# Patient Record
Sex: Female | Born: 1995 | Race: White | Hispanic: No | Marital: Single | Smoking: Former smoker
Health system: Southern US, Community
[De-identification: ages and names within clinical notes are randomized; demographics above are authoritative.]

## PROBLEM LIST (undated history)

## (undated) DIAGNOSIS — F4310 Post-traumatic stress disorder, unspecified: Secondary | ICD-10-CM

## (undated) DIAGNOSIS — F603 Borderline personality disorder: Secondary | ICD-10-CM

## (undated) DIAGNOSIS — F319 Bipolar disorder, unspecified: Secondary | ICD-10-CM

## (undated) HISTORY — PX: FRACTURE SURGERY: SHX138

---

## 2017-02-02 ENCOUNTER — Ambulatory Visit (HOSPITAL_COMMUNITY)
Admission: EM | Admit: 2017-02-02 | Discharge: 2017-02-02 | Disposition: A | Discharge status: Home / Self Care | Payer: Medicaid Other | Attending: Family Medicine | Admitting: Family Medicine

## 2017-02-02 ENCOUNTER — Encounter (HOSPITAL_COMMUNITY): Payer: Self-pay | Admitting: Emergency Medicine

## 2017-02-02 DIAGNOSIS — L03032 Cellulitis of left toe: Secondary | ICD-10-CM | POA: Diagnosis not present

## 2017-02-02 HISTORY — DX: Post-traumatic stress disorder, unspecified: F43.10

## 2017-02-02 HISTORY — DX: Bipolar disorder, unspecified: F31.9

## 2017-02-02 HISTORY — DX: Borderline personality disorder: F60.3

## 2017-02-02 MED ORDER — MUPIROCIN CALCIUM 2 % EX CREA: 1.0000 "application " | TOPICAL_CREAM | Freq: Two times a day (BID) | CUTANEOUS | 0 refills | Status: AC

## 2017-02-02 MED ORDER — CEPHALEXIN 500 MG PO CAPS: 500.0000 mg | ORAL_CAPSULE | Freq: Four times a day (QID) | ORAL | 0 refills | Status: AC

## 2017-02-02 NOTE — ED Triage Notes (Signed)
PT reports infected ingrown toenail on left great toe.

## 2017-02-02 NOTE — Discharge Instructions (Signed)
Oral antibiotic and antibiotic cream sent in.  Take oral antibiotic 4 times a day for 5 days. Conintue to do warm soaks with soapy water. Dry area after soak. Add cream to area after soak. Try to do 2-3 times a da. Try to keep nails trimmed to prevent ingrown nails. Please return in 4 days at the end of the week if nail is not improving.

## 2017-02-02 NOTE — ED Provider Notes (Signed)
[facility]    CSN: [id number] Arrival date & time: 02/02/17  [id number]     History   Chief Complaint Chief Complaint  Patient presents with  . Nail Problem    HPI Carrie Carr is a 21 y.o. female presenting with pain and redness to her left great toe. Patient accompanied by group home mother. She states 3 months ago when she was in jail she had one of her jailmates cut away an ingrown toe nail. She cannot state how they did this. Since this time she has had continues pain and redness around her toe. She was recently released and joined a group home. At the group home they have tried epsom salt soaks once a day and OTC triple antibiotic cream. Patient states it has improved slightly but not much. Area is draining as there is often blood on her sheets and socks. No fevers, no numbness. Pain worsens with movement.  HPI  Past Medical History:  Diagnosis Date  . Bipolar 1 disorder (HCC)   . Borderline personality disorder (HCC)   . PTSD (post-traumatic stress disorder)     There are no active problems to display for this patient.   Past Surgical History:  Procedure Laterality Date  . FRACTURE SURGERY      OB History    No data available       Home Medications    Prior to Admission medications   Medication Sig Start Date End Date Taking? Authorizing Provider  Melatonin 3 MG TABS Take 6 mg by mouth.   Yes [provider]  metFORMIN (GLUCOPHAGE-XR) 500 MG 24 hr tablet Take 500 mg by mouth daily with breakfast.   Yes [provider]  Oxcarbazepine (TRILEPTAL) 300 MG tablet Take 300 mg by mouth 2 (two) times daily.   Yes [provider]  propranolol (INDERAL) 10 MG tablet Take 10 mg by mouth 3 (three) times daily.   Yes [provider]  cephALEXin (KEFLEX) 500 MG capsule Take 1 capsule (500 mg total) by mouth 4 (four) times daily for 5 days. 02/02/17 02/07/17  Alazia Crocket C, PA-C  mupirocin cream (BACTROBAN) 2 % Apply 1  application topically 2 (two) times daily for 7 days. 02/02/17 02/09/17  Keelynn Furgerson, Junius CreamerHallie C, PA-C    Family History No family history on file.  Social History Social History   Tobacco Use  . Smoking status: Never Smoker  . Smokeless tobacco: Never Used  Substance Use Topics  . Alcohol use: No    Frequency: Never  . Drug use: No     Allergies   Thorazine [chlorpromazine]   Review of Systems Review of Systems  Constitutional: Negative for fatigue and fever.  Gastrointestinal: Negative for nausea and vomiting.  Musculoskeletal: Negative for gait problem.       Left great toe pain  Skin: Positive for color change. Negative for pallor.     Physical Exam Triage Vital Signs ED Triage Vitals  Enc Vitals Group     BP 02/02/17 1019 104/71     Pulse Rate 02/02/17 1018 74     Resp 02/02/17 1018 16     Temp 02/02/17 1018 98.5 F (36.9 C)     Temp Source 02/02/17 1018 Oral     SpO2 02/02/17 1018 97 %     Weight 02/02/17 1019 234 lb (106.1 kg)     Height 02/02/17 1017 5\' 3"  (1.6 m)     Head Circumference --      Peak Flow --  Pain Score 02/02/17 1018 8     Pain Loc --      Pain Edu? --      Excl. in GC? --    No data found.  Updated Vital Signs BP 104/71   Pulse 74   Temp 98.5 F (36.9 C) (Oral)   Resp 16   Ht 5\' 3"  (1.6 m)   Wt 234 lb (106.1 kg)   SpO2 97%   BMI 41.45 kg/m    Physical Exam  Constitutional: She is oriented to person, place, and time. She appears well-developed and well-nourished.  Musculoskeletal:  ROM of left great toe normal with flexion and extension.   Neurological: She is alert and oriented to person, place, and time.  Skin: Skin is warm and dry. Capillary refill takes less than 2 seconds.  Erythema to medial aspect of left great toe. Dried pus and blood to medial nail fold.           UC Treatments / Results  Labs (all labs ordered are listed, but only abnormal results are displayed) Labs Reviewed - No data to  display  EKG  EKG Interpretation None       Radiology No results found.  Procedures Procedures (including critical care time)  Medications Ordered in UC Medications - No data to display   Initial Impression / Assessment and Plan / UC Course  I have reviewed the triage vital signs and the nursing notes.  Pertinent labs & imaging results that were available during my care of the patient were reviewed by me and considered in my medical decision making (see chart for details).    Paronychia- prescribed oral Keflex, advised to continue warm soapy soaks 2-3 times a day followed my mupirocin ointment. If symptoms are not improving, please come back on Friday for re-evaluation. Advised to try and keep toenails trimmed.  Final Clinical Impressions(s) / UC Diagnoses   Final diagnoses:  Paronychia of great toe, left    ED Discharge Orders        Ordered    cephALEXin (KEFLEX) 500 MG capsule  4 times daily     02/02/17 1046    mupirocin cream (BACTROBAN) 2 %  2 times daily     02/02/17 1046       Controlled Substance Prescriptions  Controlled Substance Registry consulted? Not Applicable   Lew DawesWieters, Moosa Bueche C, [place]PA-C 02/02/17 1105

## 2017-04-16 ENCOUNTER — Ambulatory Visit (INDEPENDENT_AMBULATORY_CARE_PROVIDER_SITE_OTHER): Payer: Medicaid Other | Admitting: Podiatry

## 2017-04-16 ENCOUNTER — Encounter: Payer: Self-pay | Admitting: Podiatry

## 2017-04-16 VITALS — BP 100/50 | HR 89 | Resp 16

## 2017-04-16 DIAGNOSIS — L600 Ingrowing nail: Secondary | ICD-10-CM

## 2017-04-16 NOTE — Progress Notes (Addendum)
  Subjective:  Patient ID: Carrie Carr, female    DOB: 10/05/1995,  MRN: [id number]  Chief Complaint  Patient presents with  . Toe Pain    Hallux and 3rd toes left - medial border, red, swollen, PCP rx'd antibiotic-no help, also 4th toenails right is thick and sore sometimes   22 y.o. female presents with the above complaint.  Reports painful ingrown nail to his left great toe and third toe.  Was given antibiotic by her PCP.  Past Medical History:  Diagnosis Date  . Bipolar 1 disorder (HCC)   . Borderline personality disorder (HCC)   . PTSD (post-traumatic stress disorder)    Past Surgical History:  Procedure Laterality Date  . FRACTURE SURGERY      Current Outpatient Medications:  Marland Kitchen.  Melatonin 3 MG TABS, Take 6 mg by mouth., Disp: , Rfl:  .  metFORMIN (GLUCOPHAGE-XR) 500 MG 24 hr tablet, Take 500 mg by mouth daily with breakfast., Disp: , Rfl:  .  Oxcarbazepine (TRILEPTAL) 300 MG tablet, Take 300 mg by mouth 2 (two) times daily., Disp: , Rfl:  .  propranolol (INDERAL) 10 MG tablet, Take 10 mg by mouth 3 (three) times daily., Disp: , Rfl:   Allergies  Allergen Reactions  . Thorazine [Chlorpromazine] Other (See Comments)    seizure   Review of Systems  All other systems reviewed and are negative.  Objective:   Vitals:   04/16/17 1433  BP: (!) 100/50  Pulse: 89  Resp: 16   General AA&O x3. Normal mood and affect.  Vascular Dorsalis pedis and posterior tibial pulses  present 2+ bilaterally  Capillary refill normal to all digits. Pedal hair growth normal.  Neurologic Epicritic sensation present bilaterally.  Dermatologic No open lesions. Interspaces clear of maceration.  Normal skin temperature and turgor. Painful ingrowing nail at medial nail borders of the hallux nail left, third toe left.  Orthopedic: MMT 5/5 in dorsiflexion, plantarflexion, inversion, and eversion. Normal lower extremity joint ROM without pain or crepitus.    Assessment & Plan:  Patient was  evaluated and treated and all questions answered.  Ingrown Nail, left hallux, 3rd toe -Patient elects to proceed with ingrown toenail removal today -Ingrown nail excised. See procedure note. -Educated on post-procedure care including soaking. Written instructions provided. -Patient to follow up in 2 weeks for nail check.  Procedure: Excision of Ingrown Toenail Location: Left 1st toe medial nail border, 3rd toe medial nail border Anesthesia: Lidocaine 1% plain; 1.545mL and Marcaine 0.5% plain; 1.905mL, digital block each toe Skin Prep: Alcohol. Dressing: Silvadene; telfa; dry, sterile, compression dressing. Technique: Following skin prep, the toe was exsanguinated and a tourniquet was secured at the base of the toe. The affected nail border was freed, split with a nail splitter, and excised. Chemical matrixectomy was then performed with phenol and irrigated out with alcohol. The tourniquet was then removed and sterile dressing applied. Disposition: Patient tolerated procedure well. Patient to return in 2 weeks for follow-up.    No Follow-up on file.

## 2017-04-16 NOTE — Patient Instructions (Signed)

## 2017-05-05 ENCOUNTER — Ambulatory Visit (INDEPENDENT_AMBULATORY_CARE_PROVIDER_SITE_OTHER): Payer: Medicaid Other | Admitting: Podiatry

## 2017-05-05 ENCOUNTER — Encounter: Payer: Self-pay | Admitting: Podiatry

## 2017-05-05 DIAGNOSIS — M79676 Pain in unspecified toe(s): Secondary | ICD-10-CM

## 2017-05-05 DIAGNOSIS — L600 Ingrowing nail: Secondary | ICD-10-CM

## 2017-05-06 NOTE — Progress Notes (Signed)
  Subjective:  Patient ID: Carrie Carr, female    DOB: Aug 12, 1995,  MRN: [id number]  Chief Complaint  Patient presents with  . Nail Problem    i am doing good on my toenails    22 y.o. female returns for the above complaint.  States that she is doing well.  Still having some pain in the big toe.  Objective:   General AA&O x3. Normal mood and affect.  Vascular Foot warm and well perfused with good capillary refill.  Neurologic Sensation grossly intact.  Dermatologic Nail avulsion site healing well without drainage or erythema. Nail bed with overlying soft crust. Left intact. No signs of local infection.  Orthopedic: No tenderness to palpation of the toe.   Assessment & Plan:  Patient was evaluated and treated and all questions answered.  S/p Ingrown Toenail Excision, left great toe, third toe -Healing well without issue. -Discussed return precautions. -F/u PRN

## 2017-05-27 ENCOUNTER — Ambulatory Visit (HOSPITAL_COMMUNITY)
Admission: EM | Admit: 2017-05-27 | Discharge: 2017-05-27 | Disposition: A | Discharge status: Home / Self Care | Payer: Medicaid Other | Attending: Family Medicine | Admitting: Family Medicine

## 2017-05-27 ENCOUNTER — Other Ambulatory Visit: Payer: Self-pay

## 2017-05-27 ENCOUNTER — Encounter (HOSPITAL_COMMUNITY): Payer: Self-pay | Admitting: Emergency Medicine

## 2017-05-27 DIAGNOSIS — Z79899 Other long term (current) drug therapy: Secondary | ICD-10-CM | POA: Diagnosis not present

## 2017-05-27 DIAGNOSIS — L739 Follicular disorder, unspecified: Secondary | ICD-10-CM | POA: Insufficient documentation

## 2017-05-27 DIAGNOSIS — F4310 Post-traumatic stress disorder, unspecified: Secondary | ICD-10-CM | POA: Insufficient documentation

## 2017-05-27 DIAGNOSIS — Z7984 Long term (current) use of oral hypoglycemic drugs: Secondary | ICD-10-CM | POA: Insufficient documentation

## 2017-05-27 DIAGNOSIS — N760 Acute vaginitis: Secondary | ICD-10-CM | POA: Diagnosis not present

## 2017-05-27 DIAGNOSIS — F319 Bipolar disorder, unspecified: Secondary | ICD-10-CM | POA: Diagnosis not present

## 2017-05-27 DIAGNOSIS — F603 Borderline personality disorder: Secondary | ICD-10-CM | POA: Insufficient documentation

## 2017-05-27 DIAGNOSIS — N898 Other specified noninflammatory disorders of vagina: Secondary | ICD-10-CM | POA: Diagnosis present

## 2017-05-27 LAB — POCT URINALYSIS DIP (DEVICE)
Bilirubin Urine: NEGATIVE
Glucose, UA: NEGATIVE mg/dL
Ketones, ur: NEGATIVE mg/dL
Nitrite: NEGATIVE
PH: 6 (ref 5.0–8.0)
PROTEIN: NEGATIVE mg/dL
UROBILINOGEN UA: 0.2 mg/dL (ref 0.0–1.0)

## 2017-05-27 LAB — POCT PREGNANCY, URINE: Preg Test, Ur: NEGATIVE

## 2017-05-27 MED ORDER — CLOTRIMAZOLE 1 % VA CREA: TOPICAL_CREAM | VAGINAL | 0 refills | Status: DC

## 2017-05-27 MED ORDER — CEPHALEXIN 500 MG PO CAPS: 500.0000 mg | ORAL_CAPSULE | Freq: Four times a day (QID) | ORAL | 0 refills | Status: AC

## 2017-05-27 MED ORDER — FLUCONAZOLE 200 MG PO TABS: 200.0000 mg | ORAL_TABLET | Freq: Once | ORAL | 0 refills | Status: AC

## 2017-05-27 NOTE — ED Provider Notes (Signed)
[facility]    CSN: [id number] Arrival date & time: 05/27/17  1000     History   Chief Complaint Chief Complaint  Patient presents with  . vaginal irritation    HPI Carrie Carr is a 22 y.o. female.   Rumor presents with complaints of vulvar irritation after using nair last night. States she has had bump from shaving to the pubic region for approximately 1 week. She has burning with urination. External itching and burning. Without abdominal pain, urinary frequency, or fevers. Denies any visible vaginal discharge. LMP was 1 year ago. She has not been sexually active in 4 months. She had not used protection. She is not on birth control.    ROS per HPI.      Past Medical History:  Diagnosis Date  . Bipolar 1 disorder (HCC)   . Borderline personality disorder (HCC)   . PTSD (post-traumatic stress disorder)     There are no active problems to display for this patient.   Past Surgical History:  Procedure Laterality Date  . FRACTURE SURGERY      OB History   None      Home Medications    Prior to Admission medications   Medication Sig Start Date End Date Taking? Authorizing Provider  Melatonin 3 MG TABS Take 6 mg by mouth.   Yes [provider]  metFORMIN (GLUCOPHAGE-XR) 500 MG 24 hr tablet Take 500 mg by mouth daily with breakfast.   Yes [provider]  Oxcarbazepine (TRILEPTAL) 300 MG tablet Take 300 mg by mouth 2 (two) times daily.   Yes [provider]  propranolol (INDERAL) 10 MG tablet Take 10 mg by mouth 3 (three) times daily.   Yes [provider]  cephALEXin (KEFLEX) 500 MG capsule Take 1 capsule (500 mg total) by mouth 4 (four) times daily for 7 days. 05/27/17 06/03/17  Georgetta HaberBurky, Abcde Oneil B, NP  clotrimazole (GYNE-LOTRIMIN) 1 % vaginal cream Apply twice a day to affected skin fold 05/27/17   Linus MakoBurky, Kassidy Dockendorf B, NP  fluconazole (DIFLUCAN) 200 MG tablet Take 1 tablet (200 mg total) by mouth once for 1 dose. 05/27/17  05/27/17  Georgetta HaberBurky, Ceciley Buist B, NP    Family History History reviewed. No pertinent family history.  Social History Social History   Tobacco Use  . Smoking status: Never Smoker  . Smokeless tobacco: Never Used  Substance Use Topics  . Alcohol use: No    Frequency: Never  . Drug use: No     Allergies   Thorazine [chlorpromazine]   Review of Systems Review of Systems   Physical Exam Triage Vital Signs ED Triage Vitals  Enc Vitals Group     BP 05/27/17 1032 (!) 137/92     Pulse Rate 05/27/17 1032 80     Resp --      Temp 05/27/17 1032 97.9 F (36.6 C)     Temp Source 05/27/17 1032 Oral     SpO2 05/27/17 1032 97 %     Weight --      Height --      Head Circumference --      Peak Flow --      Pain Score 05/27/17 1028 10     Pain Loc --      Pain Edu? --      Excl. in GC? --    No data found.  Updated Vital Signs BP (!) 137/92 (BP Location: Right Wrist)   Pulse 80   Temp 97.9 F (  36.6 C) (Oral)   SpO2 97%   Visual Acuity Right Eye Distance:   Left Eye Distance:   Bilateral Distance:    Right Eye Near:   Left Eye Near:    Bilateral Near:     Physical Exam  Constitutional: She is oriented to person, place, and time. She appears well-developed and well-nourished. No distress.  Cardiovascular: Normal rate, regular rhythm and normal heart sounds.  Pulmonary/Chest: Effort normal and breath sounds normal.  Abdominal: There is no tenderness.    Genitourinary:  Genitourinary Comments: Folliculitis with redness and swelling to pubic region; redness and mild excoriation to left groin skin folds; vulva with significant redness and irritation; thin white vaginal discharge on exam; internal exam limited by patient discomfor   Neurological: She is alert and oriented to person, place, and time.  Skin: Skin is warm and dry.     UC Treatments / Results  Labs (all labs ordered are listed, but only abnormal results are displayed) Labs Reviewed  POCT URINALYSIS DIP  (DEVICE) - Abnormal; Notable for the following components:      Result Value   Hgb urine dipstick TRACE (*)    Leukocytes, UA LARGE (*)    All other components within normal limits  URINE CULTURE  POCT PREGNANCY, URINE  CERVICOVAGINAL ANCILLARY ONLY    EKG  EKG Interpretation None       Radiology No results found.  Procedures Procedures (including critical care time)  Medications Ordered in UC Medications - No data to display   Initial Impression / Assessment and Plan / UC Course  I have reviewed the triage vital signs and the nursing notes.  Pertinent labs & imaging results that were available during my care of the patient were reviewed by me and considered in my medical decision making (see chart for details).     Urine culture pending. Vaginal cytology  Pending. Will treat for yeast at this time as well as for folliculitis. Will notify of any positive findings and if any changes to treatment are needed.  Negative pregnancy. To follow up with pcp in regards to amenorrhea. Patient verbalized understanding and agreeable to plan.    Final Clinical Impressions(s) / UC Diagnoses   Final diagnoses:  Folliculitis  Vaginitis and vulvovaginitis    ED Discharge Orders        Ordered    cephALEXin (KEFLEX) 500 MG capsule  4 times daily     05/27/17 1053    fluconazole (DIFLUCAN) 200 MG tablet   Once     05/27/17 1053    clotrimazole (GYNE-LOTRIMIN) 1 % vaginal cream     05/27/17 1053       Controlled Substance Prescriptions [place] Controlled Substance Registry consulted? Not Applicable   Georgetta Haber, NP 05/27/17 1059

## 2017-05-27 NOTE — Discharge Instructions (Signed)
Please take course of keflex for the infected hair follicle.  Do not use Darene Lamerair to area until all skin is healed, not to be used on open skin. Take 1 diflucan today. Apply cream to vulva and left groin rash.  Please follow up with your primary care provider for recheck of your symptoms and to follow up with your menstrual period.

## 2017-05-27 NOTE — ED Triage Notes (Signed)
Pt states she used Darene Lamerair last night and she is now having irritation and burning with her external vaginal area.  She also reports that she has had a bump down there for about a week.

## 2017-05-28 LAB — URINE CULTURE

## 2017-05-28 LAB — CERVICOVAGINAL ANCILLARY ONLY
BACTERIAL VAGINITIS: POSITIVE — AB
CANDIDA VAGINITIS: NEGATIVE
CHLAMYDIA, DNA PROBE: NEGATIVE
NEISSERIA GONORRHEA: NEGATIVE
TRICH (WINDOWPATH): NEGATIVE

## 2017-05-29 ENCOUNTER — Telehealth: Payer: Self-pay | Admitting: Internal Medicine

## 2017-05-29 NOTE — Telephone Encounter (Signed)
Clinical staff, please let patient know that test for gardnerella (bacterial vaginosis) was positive.  This only needs to be treated if there are persistent symptoms, such as vaginal irritation/discharge.   If these symptoms are present, ok to send rx for metronidazole 500mg  bid x 7d #14 no refills or metronidazole vaginal gel 0.75% 1 applicatorful bid x 7d #14 no refills.  Recheck or followup with your primary care provider for further evaluation if symptoms are not improving.   Urine culture does not suggest a UTI.  Ria ClockLaura Devynn Scheff MD

## 2017-07-05 ENCOUNTER — Encounter: Payer: Self-pay | Admitting: Obstetrics and Gynecology

## 2017-07-05 ENCOUNTER — Ambulatory Visit (INDEPENDENT_AMBULATORY_CARE_PROVIDER_SITE_OTHER): Payer: Medicaid Other | Admitting: Obstetrics and Gynecology

## 2017-07-05 ENCOUNTER — Other Ambulatory Visit (HOSPITAL_COMMUNITY)
Admission: RE | Admit: 2017-07-05 | Discharge: 2017-07-05 | Disposition: A | Discharge status: Home / Self Care | Payer: Medicaid Other | Source: Ambulatory Visit | Attending: Obstetrics and Gynecology | Admitting: Obstetrics and Gynecology

## 2017-07-05 VITALS — BP 115/77 | HR 88 | Temp 98.1°F | Wt 252.1 lb

## 2017-07-05 DIAGNOSIS — Z113 Encounter for screening for infections with a predominantly sexual mode of transmission: Secondary | ICD-10-CM | POA: Diagnosis not present

## 2017-07-05 DIAGNOSIS — Z3202 Encounter for pregnancy test, result negative: Secondary | ICD-10-CM | POA: Diagnosis not present

## 2017-07-05 DIAGNOSIS — N911 Secondary amenorrhea: Secondary | ICD-10-CM

## 2017-07-05 LAB — POCT URINE PREGNANCY: PREG TEST UR: NEGATIVE

## 2017-07-05 MED ORDER — LO LOESTRIN FE 1 MG-10 MCG / 10 MCG PO TABS: 1.0000 | ORAL_TABLET | Freq: Every day | ORAL | 3 refills | Status: DC

## 2017-07-05 NOTE — Addendum Note (Signed)
Addended by: Dalphine Handing on: 07/05/2017 10:46 AM   Modules accepted: Orders

## 2017-07-05 NOTE — Progress Notes (Signed)
Pt c/o no menses in 2 yrs and c/o intermittent cramping. Pt also c/o vaginal odor and outer vulva itching. Pt agrees to all STD testing today.  UPT today is Negative

## 2017-07-05 NOTE — Addendum Note (Signed)
Addended by: Dalphine Handing on: 07/05/2017 11:45 AM   Modules accepted: Orders

## 2017-07-05 NOTE — Progress Notes (Signed)
22 yo G0 presenting today for the evaluation of a 2-year history of amenorrhea. Patient reports regular monthly menses lasting 4-5 days until 2 years ago. She also reports the presence of vulva pruritis with an odor. She denies any abnormal discharge. She is currently not sexually active and is not on any form of contraception. Upon review of her chart, patient reports being started on Trileptal 3 years ago. She reports feeling well and is currently living in a group home.  Past Medical History:  Diagnosis Date  . Bipolar 1 disorder (HCC)   . Borderline personality disorder (HCC)   . PTSD (post-traumatic stress disorder)    Past Surgical History:  Procedure Laterality Date  . FRACTURE SURGERY     Family History  Adopted: Yes  Problem Relation Age of Onset  . Bipolar disorder Mother    Social History   Tobacco Use  . Smoking status: Never Smoker  . Smokeless tobacco: Never Used  Substance Use Topics  . Alcohol use: No    Frequency: Never  . Drug use: No   ROS See pertinent in HPI  Blood pressure 115/77, pulse 88, temperature 98.1 F (36.7 C), weight 252 lb 1.6 oz (114.4 kg), last menstrual period 12/08/2015. GENERAL: Well-developed, well-nourished female in no acute distress.  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

## 2017-07-06 LAB — URINE CYTOLOGY ANCILLARY ONLY
Chlamydia: NEGATIVE
Neisseria Gonorrhea: NEGATIVE
Trichomonas: NEGATIVE

## 2017-07-08 LAB — URINE CYTOLOGY ANCILLARY ONLY: Candida vaginitis: NEGATIVE

## 2017-07-08 MED ORDER — METRONIDAZOLE 500 MG PO TABS: 500.0000 mg | ORAL_TABLET | Freq: Two times a day (BID) | ORAL | 0 refills | Status: DC

## 2017-07-08 NOTE — Addendum Note (Signed)
Addended by: Catalina Antigua on: 07/08/2017 04:31 PM   Modules accepted: Orders

## 2017-08-28 ENCOUNTER — Emergency Department (HOSPITAL_COMMUNITY)
Admission: EM | Admit: 2017-08-28 | Discharge: 2017-08-29 | Disposition: A | Discharge status: Home / Self Care | Payer: Medicaid Other | Attending: Emergency Medicine | Admitting: Emergency Medicine

## 2017-08-28 ENCOUNTER — Encounter (HOSPITAL_COMMUNITY): Payer: Self-pay | Admitting: Obstetrics and Gynecology

## 2017-08-28 ENCOUNTER — Other Ambulatory Visit: Payer: Self-pay

## 2017-08-28 DIAGNOSIS — F4324 Adjustment disorder with disturbance of conduct: Secondary | ICD-10-CM

## 2017-08-28 DIAGNOSIS — R45850 Homicidal ideations: Secondary | ICD-10-CM | POA: Insufficient documentation

## 2017-08-28 DIAGNOSIS — Z87891 Personal history of nicotine dependence: Secondary | ICD-10-CM | POA: Insufficient documentation

## 2017-08-28 DIAGNOSIS — R45851 Suicidal ideations: Secondary | ICD-10-CM | POA: Diagnosis not present

## 2017-08-28 DIAGNOSIS — F331 Major depressive disorder, recurrent, moderate: Secondary | ICD-10-CM | POA: Insufficient documentation

## 2017-08-28 DIAGNOSIS — Z046 Encounter for general psychiatric examination, requested by authority: Secondary | ICD-10-CM | POA: Diagnosis present

## 2017-08-28 LAB — CBC
HCT: 42.5 % (ref 36.0–46.0)
HEMOGLOBIN: 13.7 g/dL (ref 12.0–15.0)
MCH: 29.7 pg (ref 26.0–34.0)
MCHC: 32.2 g/dL (ref 30.0–36.0)
MCV: 92.2 fL (ref 78.0–100.0)
Platelets: 323 10*3/uL (ref 150–400)
RBC: 4.61 MIL/uL (ref 3.87–5.11)
RDW: 12.9 % (ref 11.5–15.5)
WBC: 9.3 10*3/uL (ref 4.0–10.5)

## 2017-08-28 LAB — COMPREHENSIVE METABOLIC PANEL
ALBUMIN: 4.1 g/dL (ref 3.5–5.0)
ALT: 22 U/L (ref 14–54)
ANION GAP: 10 (ref 5–15)
AST: 19 U/L (ref 15–41)
Alkaline Phosphatase: 95 U/L (ref 38–126)
BUN: 20 mg/dL (ref 6–20)
CHLORIDE: 108 mmol/L (ref 101–111)
CO2: 23 mmol/L (ref 22–32)
Calcium: 9.4 mg/dL (ref 8.9–10.3)
Creatinine, Ser: 0.77 mg/dL (ref 0.44–1.00)
GFR calc non Af Amer: >60 mL/min (ref >60)
Glucose, Bld: 94 mg/dL (ref 65–99)
POTASSIUM: 4.2 mmol/L (ref 3.5–5.1)
SODIUM: 141 mmol/L (ref 135–145)
Total Bilirubin: 0.6 mg/dL (ref 0.3–1.2)
Total Protein: 7.8 g/dL (ref 6.5–8.1)

## 2017-08-28 LAB — ETHANOL: Alcohol, Ethyl (B): <10 mg/dL (ref <10)

## 2017-08-28 LAB — SALICYLATE LEVEL

## 2017-08-28 LAB — ACETAMINOPHEN LEVEL

## 2017-08-28 LAB — I-STAT BETA HCG BLOOD, ED (MC, WL, AP ONLY): I-stat hCG, quantitative: <5 m[IU]/mL (ref <5)

## 2017-08-28 LAB — RAPID URINE DRUG SCREEN, HOSP PERFORMED
Amphetamines: NOT DETECTED
BENZODIAZEPINES: NOT DETECTED
COCAINE: NOT DETECTED
OPIATES: NOT DETECTED
TETRAHYDROCANNABINOL: NOT DETECTED

## 2017-08-28 MED ORDER — ACETAMINOPHEN 325 MG PO TABS: 650.0000 mg | ORAL_TABLET | ORAL | Status: DC | PRN
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
## 2017-08-28 NOTE — ED Notes (Signed)
Bed: [facility] Expected date:  Expected time:  Means of arrival:  Comments: 

## 2017-08-28 NOTE — ED Notes (Signed)
Patient calm and cooperative.  Oriented to the unit and 15 minute safety checks initiated.  Video monitoring in place.  Patient denies AV hallucinations but admits to SI and HI thoughts without a plan.

## 2017-08-28 NOTE — ED Notes (Signed)
Patient reports SI/Hi with no plan but denies AVH. Plan of care discussed. Encouragement and support provided and safety maintain. Q 15 min safety checks in place and video monitoring.

## 2017-08-28 NOTE — BH Assessment (Signed)
[facility] Assessment Progress Note  Case was staffed with Melvyn NethLewis NP who recommended patient be monitored and observed for safety. Patient will be seen by psychiatry in the a.m.
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
## 2017-08-29 DIAGNOSIS — F4324 Adjustment disorder with disturbance of conduct: Secondary | ICD-10-CM

## 2017-08-29 DIAGNOSIS — R45850 Homicidal ideations: Secondary | ICD-10-CM | POA: Insufficient documentation

## 2017-08-29 DIAGNOSIS — R45851 Suicidal ideations: Secondary | ICD-10-CM | POA: Insufficient documentation

## 2017-08-29 MED ORDER — OXCARBAZEPINE 300 MG PO TABS
300.0000 mg | ORAL_TABLET | Freq: Two times a day (BID) | ORAL | Status: DC
  Administered 2017-08-29: 300 mg via ORAL
  Filled 2017-08-29: qty 1

## 2017-08-29 NOTE — Progress Notes (Signed)
Per Psychiatrist Akintayo and NP [facility], patient psychiatrically cleared. Patient from [facility]. CSW contact Group Home staff member [facility](Chantay Jimmey Ralpharker [phone]) and informed staff that patient is psychiatrically cleared and ready to be picked up. Staff reported that they will pick up patient up after church. CSW updated patient's RN. CSW signing off, no other needs identified at this time.  Celso SickleKimberly Luiza [place], [place] Clinical Social Worker [facility] Cell#: [phone]
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
## 2017-08-29 NOTE — ED Notes (Signed)
Patient stayed in her room the entire shift. Patient has been calm and cooperative during this shift.

## 2017-08-29 NOTE — BHH Suicide Risk Assessment (Cosign Needed)
Suicide Risk Assessment  Discharge Assessment   [facility]   Principal Problem: Adjustment disorder with disturbance of conduct Discharge Diagnoses:  Patient Active Problem List   Diagnosis Date Noted  . Adjustment disorder with disturbance of conduct [F43.24] 08/29/2017  . Homicidal ideation [R45.850]   . Suicidal ideation [R45.851]     Total Time spent with patient: 45 minutes  Musculoskeletal: Strength & Muscle Tone: within normal limits Gait & Station: normal Patient leans: N/A  Psychiatric Specialty Exam: Physical Exam  Constitutional: She is oriented to person, place, and time. She appears well-developed and well-nourished.  HENT:  Head: Normocephalic.  Respiratory: Effort normal.  Musculoskeletal: Normal range of motion.  Neurological: She is alert and oriented to person, place, and time.  Psychiatric: Her speech is normal. Thought content normal. She is agitated. Cognition and memory are normal. She expresses impulsivity. She exhibits a depressed mood.   Review of Systems  Psychiatric/Behavioral: Positive for depression. Negative for hallucinations, memory loss, substance abuse and suicidal ideas. The patient is not nervous/anxious and does not have insomnia.   All other systems reviewed and are negative.  Blood pressure 127/61, pulse 72, temperature 98.4 F (36.9 C), temperature source Oral, resp. rate 18, last menstrual period 08/28/2016, SpO2 98 %.There is no height or weight on file to calculate BMI. General Appearance: Casual Eye Contact:  Good Speech:  Clear and Coherent Volume:  Normal Mood:  Anxious, Depressed and Irritable Affect:  Congruent Thought Process:  Coherent, Goal Directed and Linear Orientation:  Full (Time, Place, and Person) Thought Content:  Logical Suicidal Thoughts:  No Homicidal Thoughts:  No Memory:  Immediate;   Good Recent;   Fair Remote;   Fair Judgement:  Impaired Insight:  Lacking Psychomotor  Activity:  Normal Concentration:  Concentration: Good and Attention Span: Good Recall:  Fair Fund of Knowledge:  Good Language:  Good Akathisia:  No Handed:  Right AIMS (if indicated):    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 Housing Social Support ADL's:  Intact Cognition:  WNL Sleep:   Good   Mental Status Per Nursing Assessment::   On Admission:   suicidal and homicidal ideation against group home staff  Demographic Factors:  Adolescent or young adult, Low socioeconomic status and Unemployed  Loss Factors: Legal issues and Financial problems/change in socioeconomic status  Historical Factors: Impulsivity  Risk Reduction Factors:   Sense of responsibility to family  Continued Clinical Symptoms:  Bipolar Disorder:   Depressive phase Depression:   Impulsivity Personality Disorders:   Cluster B  Cognitive Features That Contribute To Risk:  Closed-mindedness    Suicide Risk:  Minimal: No identifiable suicidal ideation.  Patients presenting with no risk factors but with morbid ruminations; may be classified as minimal risk based on the severity of the depressive symptoms    Plan Of Care/Follow-up recommendations:  Activity:  as tolerated Diet:  Heart healthy  Laveda AbbeLaurie Britton Madgie Dhaliwal, NP 08/29/2017, 11:08 AM

## 2017-08-29 NOTE — BH Assessment (Addendum)
[facility]     Per Dr. Jannifer FranklinAkintayo, patient can be discharged back to her group home.  Social Work is in the process of trying to contact the group home.

## 2017-08-29 NOTE — ED Notes (Signed)
Pt discharged home. Discharged instructions read to pt who verbalized understanding. All belongings returned to pt who signed for same. Denies SI/HI, is not delusional and not responding to internal stimuli. Escorted pt to the ED exit.  Pt's Group home came after church and picked her up.

## 2017-08-30 ENCOUNTER — Encounter: Payer: Self-pay | Admitting: Obstetrics and Gynecology

## 2017-10-12 ENCOUNTER — Other Ambulatory Visit: Payer: Self-pay

## 2017-10-12 ENCOUNTER — Emergency Department (HOSPITAL_COMMUNITY)
Admission: EM | Admit: 2017-10-12 | Discharge: 2017-10-13 | Disposition: A | Discharge status: Home / Self Care | Payer: Medicaid Other | Attending: Emergency Medicine | Admitting: Emergency Medicine

## 2017-10-12 ENCOUNTER — Encounter (HOSPITAL_COMMUNITY): Payer: Self-pay

## 2017-10-12 DIAGNOSIS — F319 Bipolar disorder, unspecified: Secondary | ICD-10-CM | POA: Diagnosis not present

## 2017-10-12 DIAGNOSIS — R45851 Suicidal ideations: Secondary | ICD-10-CM | POA: Diagnosis not present

## 2017-10-12 DIAGNOSIS — F4324 Adjustment disorder with disturbance of conduct: Secondary | ICD-10-CM | POA: Diagnosis not present

## 2017-10-12 DIAGNOSIS — Z7984 Long term (current) use of oral hypoglycemic drugs: Secondary | ICD-10-CM | POA: Diagnosis not present

## 2017-10-12 DIAGNOSIS — R45850 Homicidal ideations: Secondary | ICD-10-CM | POA: Diagnosis not present

## 2017-10-12 DIAGNOSIS — Z87891 Personal history of nicotine dependence: Secondary | ICD-10-CM | POA: Insufficient documentation

## 2017-10-12 DIAGNOSIS — Z79899 Other long term (current) drug therapy: Secondary | ICD-10-CM | POA: Diagnosis not present

## 2017-10-12 LAB — CBC
HEMATOCRIT: 43.6 % (ref 36.0–46.0)
Hemoglobin: 14.6 g/dL (ref 12.0–15.0)
MCH: 30 pg (ref 26.0–34.0)
MCHC: 33.5 g/dL (ref 30.0–36.0)
MCV: 89.5 fL (ref 78.0–100.0)
Platelets: 387 10*3/uL (ref 150–400)
RBC: 4.87 MIL/uL (ref 3.87–5.11)
RDW: 12.5 % (ref 11.5–15.5)
WBC: 8.6 10*3/uL (ref 4.0–10.5)

## 2017-10-12 LAB — COMPREHENSIVE METABOLIC PANEL
ALBUMIN: 4.3 g/dL (ref 3.5–5.0)
ALT: 24 U/L (ref 0–44)
AST: 19 U/L (ref 15–41)
Alkaline Phosphatase: 130 U/L — ABNORMAL HIGH (ref 38–126)
Anion gap: 10 (ref 5–15)
BUN: 19 mg/dL (ref 6–20)
CHLORIDE: 107 mmol/L (ref 98–111)
CO2: 25 mmol/L (ref 22–32)
Calcium: 10 mg/dL (ref 8.9–10.3)
Creatinine, Ser: 0.68 mg/dL (ref 0.44–1.00)
GFR calc Af Amer: >60 mL/min (ref >60)
GFR calc non Af Amer: >60 mL/min (ref >60)
GLUCOSE: 91 mg/dL (ref 70–99)
POTASSIUM: 4.3 mmol/L (ref 3.5–5.1)
Sodium: 142 mmol/L (ref 135–145)
Total Bilirubin: 0.3 mg/dL (ref 0.3–1.2)
Total Protein: 8.1 g/dL (ref 6.5–8.1)

## 2017-10-12 LAB — RAPID URINE DRUG SCREEN, HOSP PERFORMED
Amphetamines: NOT DETECTED
BARBITURATES: NOT DETECTED
BENZODIAZEPINES: NOT DETECTED
Cocaine: NOT DETECTED
Opiates: NOT DETECTED
TETRAHYDROCANNABINOL: NOT DETECTED

## 2017-10-12 LAB — ETHANOL: Alcohol, Ethyl (B): <10 mg/dL (ref <10)

## 2017-10-12 LAB — SALICYLATE LEVEL: Salicylate Lvl: <7 mg/dL (ref 2.8–30.0)

## 2017-10-12 LAB — I-STAT BETA HCG BLOOD, ED (MC, WL, AP ONLY)

## 2017-10-12 LAB — ACETAMINOPHEN LEVEL: Acetaminophen (Tylenol), Serum: <10 ug/mL — ABNORMAL LOW (ref 10–30)

## 2017-10-12 MED ORDER — HYDROXYZINE HCL 25 MG PO TABS
25.0000 mg | ORAL_TABLET | Freq: Every evening | ORAL | Status: DC | PRN
  Administered 2017-10-12: 25 mg via ORAL
  Filled 2017-10-12: qty 1

## 2017-10-12 MED ORDER — ACETAMINOPHEN 325 MG PO TABS: 650.0000 mg | ORAL_TABLET | ORAL | Status: DC | PRN

## 2017-10-12 MED ORDER — MELATONIN 3 MG PO TABS
6.0000 mg | ORAL_TABLET | Freq: Every day | ORAL | Status: DC
  Administered 2017-10-12: 6 mg via ORAL
  Filled 2017-10-12: qty 2

## 2017-10-12 MED ORDER — PROPRANOLOL HCL 10 MG PO TABS
10.0000 mg | ORAL_TABLET | Freq: Three times a day (TID) | ORAL | Status: DC
  Administered 2017-10-12 – 2017-10-13 (×2): 10 mg via ORAL
  Filled 2017-10-12 (×3): qty 1

## 2017-10-12 MED ORDER — ZOLPIDEM TARTRATE 5 MG PO TABS: 5.0000 mg | ORAL_TABLET | Freq: Every evening | ORAL | Status: DC | PRN

## 2017-10-12 MED ORDER — METFORMIN HCL ER 500 MG PO TB24
500.0000 mg | ORAL_TABLET | Freq: Every day | ORAL | Status: DC
  Administered 2017-10-13: 500 mg via ORAL
  Filled 2017-10-12: qty 1

## 2017-10-12 MED ORDER — ONDANSETRON HCL 4 MG PO TABS: 4.0000 mg | ORAL_TABLET | Freq: Three times a day (TID) | ORAL | Status: DC | PRN

## 2017-10-12 MED ORDER — OXCARBAZEPINE 300 MG PO TABS: 300.0000 mg | ORAL_TABLET | ORAL | Status: DC

## 2017-10-12 MED ORDER — DOCUSATE SODIUM 100 MG PO CAPS
100.0000 mg | ORAL_CAPSULE | Freq: Two times a day (BID) | ORAL | Status: DC
  Administered 2017-10-12 – 2017-10-13 (×2): 100 mg via ORAL
  Filled 2017-10-12 (×2): qty 1
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
## 2017-10-12 NOTE — ED Notes (Signed)
Patient wanded by security. 

## 2017-10-12 NOTE — ED Notes (Signed)
Pt A&O x 3, no distress noted, calm & cooperative.  Pt being assessed by TTS Thurston Poundsrey at present.  Monitoring for safety, Q 15 min checks in effect.
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
## 2017-10-12 NOTE — ED Notes (Signed)
Bed: WA27 Expected date:  Expected time:  Means of arrival:  Comments: For triage
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
## 2017-10-12 NOTE — ED Notes (Signed)
Pt oriented to room and unit.  Pt is calm and cooperative at this time.  15 minute checks and video monitoring in place.
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
## 2017-10-13 DIAGNOSIS — F4324 Adjustment disorder with disturbance of conduct: Secondary | ICD-10-CM

## 2017-10-13 LAB — CBG MONITORING, ED: GLUCOSE-CAPILLARY: 87 mg/dL (ref 70–99)

## 2017-10-13 NOTE — ED Notes (Signed)
Pt was given new scrubs, towels and wash cloths to take a shower.

## 2017-10-13 NOTE — ED Notes (Signed)
Up tot he bathroom to shower and change scrubs 

## 2017-10-13 NOTE — Progress Notes (Signed)
CSW informed that patient has been psychiatrically cleared and is ready for discharge. CSW spoke with Knox SalivaChantay Parker of [facility] at [phone] regarding patient's disposition. Per Chantay, they will be here around 12pm to pick up patient and informed CSW that they are [facility] with psychiatrist. CSW updated patient's RN of disposition plan.   Archie BalboaMackenzie Irwin, LCSWA  Clinical Social Work Department  [facility]  [phone]

## 2017-10-13 NOTE — ED Notes (Signed)
Group home to pick pt up at 1200 per CSW

## 2017-10-13 NOTE — Discharge Instructions (Signed)
For your mental health needs, you are advised to continue treatment with [facility]: ° °     [facility] °     [address] °     [place], [place] [address] °     [phone] °

## 2017-10-13 NOTE — ED Notes (Addendum)
Written dc instructions reviewed with pt and group home representative Meredith StaggersFelicia Thomas. Pt denied si/hi/avh on dc. Pt encouraged to take her medications as directed and work with the group home personnel.  Pt to follow up tomorrow morning with [facility] per Sunny SchleinFelicia.  Pt ambulatory w/o difficulty to dc area, belongings returned after leaving the unit.
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
## 2017-10-13 NOTE — BH Assessment (Signed)
[facility] Assessment Progress Note  Per Juanetta BeetsJacqueline Norman, DO, this pt does not require psychiatric hospitalization at this time.  [facility]  Thea SilversmithMackenzie, LCSW agrees to facilitate pt's return to her residential facility.  Pt [facility] provider, which has been inserted into pt's discharge instructions.  Pt's nurse, Wille CelesteJanie, has been notified.  Doylene Canninghomas Avin Upperman, MA Triage Specialist [phone]

## 2017-10-13 NOTE — ED Notes (Signed)
Pt vital signs updated

## 2017-10-19 ENCOUNTER — Ambulatory Visit (HOSPITAL_COMMUNITY)
Admission: EM | Admit: 2017-10-19 | Discharge: 2017-10-19 | Disposition: A | Discharge status: Home / Self Care | Payer: Medicaid Other | Attending: Family Medicine | Admitting: Family Medicine

## 2017-10-19 ENCOUNTER — Encounter (HOSPITAL_COMMUNITY): Payer: Self-pay | Admitting: Emergency Medicine

## 2017-10-19 DIAGNOSIS — L739 Follicular disorder, unspecified: Secondary | ICD-10-CM | POA: Diagnosis not present

## 2017-10-19 MED ORDER — CEPHALEXIN 500 MG PO CAPS: 500.0000 mg | ORAL_CAPSULE | Freq: Two times a day (BID) | ORAL | 0 refills | Status: AC

## 2017-10-19 NOTE — Discharge Instructions (Addendum)
Clean area daily with warm water and mild soap Avoid shaving Prescribed keflex take as directed and to completion Follow up with PCP if symptoms persists Return or go to the ED if you have any new or worsening symptoms such as increased pain, redness, swelling, discharge, high fever, night sweats, abdominal pain, etc...Marland Kitchen

## 2017-10-19 NOTE — ED Triage Notes (Signed)
PT reports rash / breakout over inner thighs for last few weeks.

## 2017-10-19 NOTE — ED Provider Notes (Signed)
[facility]   [id number] 10/19/17 Arrival Time: 1323  CC: Rash   SUBJECTIVE:  Carrie Carr is a 22 y.o. female hx significant for bipolar 1 disorder, borderline personality disorder, and PTSD who presents with a skin complaint that began on 3 weeks.  Denies changes in soaps, detergents, or anyone with similar symptoms.  Denies known trigger, environmental exposure or allergies, or recent travel.  Last sexual activity in November of 2018.  Localizes the rash to inner thighs.  Describes it as painful, itchy, and red with clear drainage .  Has tried hydrocortisone with relief.  Symptoms are made worse at night.  Denies similar symptoms in the past.   Denies fever, chills, nausea, vomiting, swollen glands, SOB, chest pain, abdominal pain, vaginal discharge, changes in bowel or bladder function.    ROS: As per HPI.  Past Medical History:  Diagnosis Date  . Bipolar 1 disorder (HCC)   . Borderline personality disorder (HCC)   . PTSD (post-traumatic stress disorder)    Past Surgical History:  Procedure Laterality Date  . FRACTURE SURGERY     Allergies  Allergen Reactions  . Thorazine [Chlorpromazine] Other (See Comments)    seizure  . Thorazine [Chlorpromazine]     seizures   No current facility-administered medications on file prior to encounter.    Current Outpatient Medications on File Prior to Encounter  Medication Sig Dispense Refill  . cetaphil (CETAPHIL) cream Apply 1 application topically every evening.    . clindamycin-benzoyl peroxide (BENZACLIN) gel Apply 1 application topically 2 (two) times daily as needed (acne).    Marland Kitchen. docusate sodium (COLACE) 100 MG capsule Take 100 mg by mouth 2 (two) times daily.    . hydrOXYzine (VISTARIL) 25 MG capsule Take 25 mg by mouth at bedtime as needed (insomnia).    . LO LOESTRIN FE 1 MG-10 MCG / 10 MCG tablet Take 1 tablet by mouth daily. 3 Package 3  . Melatonin 3 MG TABS Take 6 mg by mouth at bedtime.     . metFORMIN  (GLUCOPHAGE-XR) 500 MG 24 hr tablet Take 500 mg by mouth daily with breakfast.    . Oxcarbazepine (TRILEPTAL) 300 MG tablet Take 300-600 mg by mouth as directed. Take 1 tablet (300 mg) by mouth every morning and Take 2 tablets (600 mg) at bedtime    . propranolol (INDERAL) 10 MG tablet Take 10 mg by mouth 3 (three) times daily.     Social History   Socioeconomic History  . Marital status: Single    Spouse name: Not on file  . Number of children: Not on file  . Years of education: Not on file  . Highest education level: Not on file  Occupational History  . Not on file  Social Needs  . Financial resource strain: Not on file  . Food insecurity:    Worry: Not on file    Inability: Not on file  . Transportation needs:    Medical: Not on file    Non-medical: Not on file  Tobacco Use  . Smoking status: Former Smoker    Years: 2.00  . Smokeless tobacco: Never Used  . Tobacco comment: Stopped this year  Substance and Sexual Activity  . Alcohol use: Not Currently    Frequency: Never  . Drug use: Not Currently  . Sexual activity: Not Currently    Partners: Male    Birth control/protection: Pill, None  Lifestyle  . Physical activity:    Days per week: Not on file  Minutes per session: Not on file  . Stress: Not on file  Relationships  . Social connections:    Talks on phone: Not on file    Gets together: Not on file    Attends religious service: Not on file    Active member of club or organization: Not on file    Attends meetings of clubs or organizations: Not on file    Relationship status: Not on file  . Intimate partner violence:    Fear of current or ex partner: Not on file    Emotionally abused: Not on file    Physically abused: Not on file    Forced sexual activity: Not on file  Other Topics Concern  . Not on file  Social History Narrative   ** Merged History Encounter **       Family History  Adopted: Yes  Problem Relation Age of Onset  . Bipolar disorder  Mother     OBJECTIVE: Vitals:   10/19/17 1344  BP: 132/80  Pulse: 97  Resp: 18  Temp: 97.9 F (36.6 C)  TempSrc: Oral  SpO2: 99%    General appearance: alert; no distress Lungs: clear to auscultation bilaterally Heart: regular rate and rhythm.  Radial pulse 2+ bilaterally Extremities: no edema Skin: declines chaperone; warm and dry; few scatter pustules localized to bilateral inner thighs with surrounding erythema; mildly tender to palpation; no active drainage Psychological: alert and cooperative; normal mood and affect  ASSESSMENT & PLAN:  1. Folliculitis     Meds ordered this encounter  Medications  . cephALEXin (KEFLEX) 500 MG capsule    Sig: Take 1 capsule (500 mg total) by mouth 2 (two) times daily for 10 days.    Dispense:  20 capsule    Refill:  0    Order Specific Question:   Supervising Provider    Answer:   Isa RankinMURRAY, LAURA WILSON [[id number]]   Clean area daily with warm water and mild soap Avoid shaving Prescribed keflex take as directed and to completion Follow up with PCP if symptoms persists Return or go to the ED if you have any new or worsening symptoms such as increased pain, redness, swelling, discharge, high fever, night sweats, abdominal pain, etc...   Reviewed expectations re: course of current medical issues. Questions answered. Outlined signs and symptoms indicating need for more acute intervention. Patient verbalized understanding. After Visit Summary given.   Rennis HardingWurst, Jonothan Heberle, PA-C 10/19/17 1419

## 2018-02-05 ENCOUNTER — Other Ambulatory Visit: Payer: Self-pay

## 2018-02-05 ENCOUNTER — Encounter (HOSPITAL_COMMUNITY): Payer: Self-pay

## 2018-02-05 ENCOUNTER — Emergency Department (HOSPITAL_COMMUNITY)
Admission: EM | Admit: 2018-02-05 | Discharge: 2018-02-05 | Disposition: A | Discharge status: Home / Self Care | Payer: Medicaid Other | Attending: Emergency Medicine | Admitting: Emergency Medicine

## 2018-02-05 DIAGNOSIS — Z87891 Personal history of nicotine dependence: Secondary | ICD-10-CM | POA: Diagnosis not present

## 2018-02-05 DIAGNOSIS — R1030 Lower abdominal pain, unspecified: Secondary | ICD-10-CM | POA: Diagnosis not present

## 2018-02-05 DIAGNOSIS — R21 Rash and other nonspecific skin eruption: Secondary | ICD-10-CM | POA: Insufficient documentation

## 2018-02-05 DIAGNOSIS — R350 Frequency of micturition: Secondary | ICD-10-CM | POA: Insufficient documentation

## 2018-02-05 LAB — URINALYSIS, ROUTINE W REFLEX MICROSCOPIC
BILIRUBIN URINE: NEGATIVE
Bacteria, UA: NONE SEEN
Glucose, UA: 50 mg/dL — AB
KETONES UR: NEGATIVE mg/dL
Nitrite: NEGATIVE
PH: 6 (ref 5.0–8.0)
PROTEIN: NEGATIVE mg/dL
Specific Gravity, Urine: 1.021 (ref 1.005–1.030)

## 2018-02-05 MED ORDER — FLUCONAZOLE 200 MG PO TABS: 200.0000 mg | ORAL_TABLET | Freq: Every day | ORAL | 0 refills | Status: DC

## 2018-02-05 MED ORDER — FLUCONAZOLE 100 MG PO TABS
200.0000 mg | ORAL_TABLET | Freq: Once | ORAL | Status: AC
  Administered 2018-02-05: 200 mg via ORAL
  Filled 2018-02-05: qty 2

## 2018-02-05 NOTE — ED Notes (Signed)
Patient verbalizes understanding of discharge instructions. Opportunity for questioning and answers were provided. Armband removed by staff, pt discharged from ED.  

## 2018-02-05 NOTE — ED Provider Notes (Signed)
Emergency Department Provider Note   I have reviewed the triage vital signs and the nursing notes.   HISTORY  Chief Complaint Rash   HPI Carrie Carr is a 22 y.o. female who presents emerged from today secondary to rash to both inner thighs.  Patient was diagnosed with folliculitis about a month ago started antibiotics and it seemed getting better but is also has not worsened.  Has some burning nature to it but no spread of erythema.  It does itch at times.  No vaginal symptoms.  No systemic see them such as fever, nausea or vomiting. Increased urination and suprapubic discomfort. No recent sexual activity and has been tested for 'All' STD's multiple times.  No other associated or modifying symptoms.    Past Medical History:  Diagnosis Date  . Bipolar 1 disorder (HCC)   . Borderline personality disorder (HCC)   . PTSD (post-traumatic stress disorder)     Patient Active Problem List   Diagnosis Date Noted  . 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 Homicidal ideation   . Suicidal ideation     Past Surgical History:  Procedure Laterality Date  . FRACTURE SURGERY      Current Outpatient Rx  . Order #: [id number] Class: Historical Med  . Order #: [id number] Class: Historical Med  . Order #: [id number] Class: Historical Med  . [START ON 02/06/2018] Order #: [id number] Class: Print  . Order #: [id number] Class: Historical Med  . Order #: [id number] Class: Normal  . Order #: [id number] Class: Historical Med  . Order #: [id number] Class: Historical Med  . Order #: [id number] Class: Historical Med  . Order #: [id number] Class: Historical Med    Allergies Thorazine [chlorpromazine] and Thorazine [chlorpromazine]  Family History  Adopted: Yes  Problem Relation Age of Onset  . Bipolar disorder Mother     Social History Social History   Tobacco Use  . Smoking status: Former Smoker    Years: 2.00  . Smokeless tobacco: Never Used  . Tobacco comment: Stopped this  year  Substance Use Topics  . Alcohol use: Not Currently    Frequency: Never  . Drug use: Not Currently    Review of Systems  All other systems negative except as documented in the HPI. All pertinent positives and negatives as reviewed in the HPI. ____________________________________________   PHYSICAL EXAM:  VITAL SIGNS: ED Triage Vitals  Enc Vitals Group     BP 02/05/18 1412 128/72     Pulse Rate 02/05/18 1412 83     Resp 02/05/18 1412 17     Temp 02/05/18 1412 98.1 F (36.7 C)     Temp Source 02/05/18 1412 Oral     SpO2 02/05/18 1412 100 %    Constitutional: Alert and oriented. Well appearing and in no acute distress. Eyes: Conjunctivae are normal. PERRL. EOMI. Head: Atraumatic. Nose: No congestion/rhinnorhea. Mouth/Throat: Mucous membranes are moist.  Oropharynx non-erythematous. Neck: No stridor.  No meningeal signs.   Cardiovascular: Normal rate, regular rhythm. Good peripheral circulation. Grossly normal heart sounds.   Respiratory: Normal respiratory effort.  No retractions. Lungs CTAB. Gastrointestinal: Soft and mild suprapubic ttp. No distention.  Musculoskeletal: No lower extremity tenderness nor edema. No gross deformities of extremities. Neurologic:  Normal speech and language. No gross focal neurologic deficits are appreciated.  Skin:  Skin is warm, dry and intact. Multiple well defined papular erythematous lesions without significant tenderness or surrounding erythema to bilateral upper inner thighs.   ____________________________________________   LABS (all labs ordered are listed, but only  abnormal results are displayed)  Labs Reviewed  URINALYSIS, ROUTINE W REFLEX MICROSCOPIC - Abnormal; Notable for the following components:      Result Value   Glucose, UA 50 (*)    Hgb urine dipstick SMALL (*)    Leukocytes, UA TRACE (*)    All other components within normal limits  URINE CULTURE   ____________________________________________  INITIAL  IMPRESSION / ASSESSMENT AND PLAN / ED COURSE  Patient did not respond at all to antibiotics if this is more likely yeast infection.  Will treat appropriately.  Will check her urinalysis for infection as well.  No UTI. Treat for yeast skin infection   Pertinent labs & imaging results that were available during my care of the patient were reviewed by me and considered in my medical decision making (see chart for details).  ____________________________________________  FINAL CLINICAL IMPRESSION(S) / ED DIAGNOSES  Final diagnoses:  Rash     MEDICATIONS GIVEN DURING THIS VISIT:  Medications  fluconazole (DIFLUCAN) tablet 200 mg (200 mg Oral Given 02/05/18 1540)     NEW OUTPATIENT MEDICATIONS STARTED DURING THIS VISIT:  Discharge Medication List as of 02/05/2018  3:12 PM    START taking these medications   Details  fluconazole (DIFLUCAN) 200 MG tablet Take 1 tablet (200 mg total) by mouth daily., Starting Sun 02/06/2018, Print        Note:  This note was prepared with assistance of Dragon voice recognition software. Occasional wrong-word or sound-a-like substitutions may have occurred due to the inherent limitations of voice recognition software.   Marily MemosMesner, Gradyn Shein, MD 02/05/18 [phone]

## 2018-02-05 NOTE — ED Triage Notes (Signed)
Pt states she has bumps to the inside of her thighs in the groin area. [facility]ul[facility] and given abx. Pt also reports she thinks she has a UTI.

## 2018-02-06 LAB — URINE CULTURE

## 2018-04-27 ENCOUNTER — Other Ambulatory Visit: Payer: Self-pay

## 2018-04-27 NOTE — Telephone Encounter (Signed)
Pharmacy calling stating that the patient was denied a birth control refill and they wanted to know why because the patient is in a group home and needs to know if the denial is because the patient has not been seen since 06/2017.

## 2018-04-28 NOTE — Telephone Encounter (Signed)
I called pharmacy and spoke w/pharmacist Jeannie to discuss the question /concern regarding pt's Rx refill for birth control. After reviewing pt's profile she stated that pt had picked up a refill of 3 packs on 04/06/18 and that should last until end of April. This was pt's last refill on the original Rx sent on 07/05/17. She will make a note on pt's profile @ the pharmacy. Per chart review, this is a pt from [facility] practice and all future questions regarding medications should be forwarded to that office.

## 2018-08-23 ENCOUNTER — Other Ambulatory Visit: Payer: Self-pay | Admitting: Obstetrics and Gynecology

## 2018-10-31 ENCOUNTER — Encounter: Payer: Self-pay | Admitting: Obstetrics and Gynecology

## 2018-10-31 ENCOUNTER — Other Ambulatory Visit (HOSPITAL_COMMUNITY)
Admission: RE | Admit: 2018-10-31 | Discharge: 2018-10-31 | Disposition: A | Discharge status: Home / Self Care | Payer: Medicaid Other | Source: Ambulatory Visit | Attending: Obstetrics and Gynecology | Admitting: Obstetrics and Gynecology

## 2018-10-31 ENCOUNTER — Ambulatory Visit (INDEPENDENT_AMBULATORY_CARE_PROVIDER_SITE_OTHER): Payer: Medicaid Other | Admitting: Obstetrics and Gynecology

## 2018-10-31 ENCOUNTER — Other Ambulatory Visit: Payer: Self-pay

## 2018-10-31 VITALS — BP 124/73 | HR 69 | Temp 98.0°F | Ht 63.0 in | Wt 223.0 lb

## 2018-10-31 DIAGNOSIS — N898 Other specified noninflammatory disorders of vagina: Secondary | ICD-10-CM | POA: Diagnosis present

## 2018-10-31 DIAGNOSIS — Z0000 Encounter for general adult medical examination without abnormal findings: Secondary | ICD-10-CM | POA: Diagnosis not present

## 2018-10-31 DIAGNOSIS — R300 Dysuria: Secondary | ICD-10-CM | POA: Diagnosis not present

## 2018-10-31 DIAGNOSIS — Z113 Encounter for screening for infections with a predominantly sexual mode of transmission: Secondary | ICD-10-CM

## 2018-10-31 DIAGNOSIS — Z124 Encounter for screening for malignant neoplasm of cervix: Secondary | ICD-10-CM | POA: Insufficient documentation

## 2018-10-31 DIAGNOSIS — Z01419 Encounter for gynecological examination (general) (routine) without abnormal findings: Secondary | ICD-10-CM

## 2018-10-31 LAB — POCT URINALYSIS DIPSTICK
Bilirubin, UA: NEGATIVE
Blood, UA: NEGATIVE
Glucose, UA: NEGATIVE
Ketones, UA: NEGATIVE
Leukocytes, UA: NEGATIVE
Nitrite, UA: NEGATIVE
Protein, UA: POSITIVE — AB
Spec Grav, UA: >=1.03 — AB (ref 1.010–1.025)
Urobilinogen, UA: 0.2 E.U./dL
pH, UA: 6 (ref 5.0–8.0)

## 2018-10-31 NOTE — Progress Notes (Signed)
Patient presents for her Annual Exam today.  CC: vaginal odor x a months. Pt also notices burning with urination.   Last pap: N/A due to age.  LMP: 2 cycles this month pt states they have been irregular for the last 2 months. Stopped a few days ago per pt uncertain of date.   Contraception: None and declines STD Screening: Not active Declines

## 2018-10-31 NOTE — Progress Notes (Signed)
GYNECOLOGY ANNUAL PREVENTATIVE CARE ENCOUNTER NOTE  Subjective:   Carrie Carr is a 23 y.o. G42 female here for a annual gynecologic exam. Current complaints: vaginal odor. Has been going "on and off."  Also with dysuria for about a week.  Reports irregular periods, has cramping at the time. Does not "want to be on birth control." Denies abnormal vaginal bleeding, discharge, pelvic pain, problems with intercourse or other gynecologic concerns.    Gynecologic History No LMP recorded. (Menstrual status: Irregular Periods). Contraception: abstinence Last Pap: believes she had one "last time she was here" Results were: n/a  Obstetric History OB History  Gravida Para Term Preterm AB Living  0 0 0 0 0 0  SAB TAB Ectopic Multiple Live Births  0 0 0 0 0    Past Medical History:  Diagnosis Date  . Bipolar 1 disorder ([place])   . Borderline personality disorder ([place])   . PTSD (post-traumatic stress disorder)     Past Surgical History:  Procedure Laterality Date  . FRACTURE SURGERY      Current Outpatient Medications on File Prior to Visit  Medication Sig Dispense Refill  . ARIPiprazole (ABILIFY IM) Inject into the muscle.    . cetaphil (CETAPHIL) cream Apply 1 application topically every evening.    . docusate sodium (COLACE) 100 MG capsule Take 100 mg by mouth 2 (two) times daily.    . Melatonin 3 MG TABS Take 6 mg by mouth at bedtime.     . metFORMIN (GLUCOPHAGE-XR) 500 MG 24 hr tablet Take 500 mg by mouth daily with breakfast.    . propranolol (INDERAL) 10 MG tablet Take 10 mg by mouth 3 (three) times daily.    . clindamycin-benzoyl peroxide (BENZACLIN) gel Apply 1 application topically 2 (two) times daily as needed (acne).    . fluconazole (DIFLUCAN) 200 MG tablet Take 1 tablet (200 mg total) by mouth daily. (Patient not taking: Reported on 10/31/2018) 7 tablet 0  . hydrOXYzine (VISTARIL) 25 MG capsule Take 25 mg by mouth at bedtime as needed (insomnia).    . Oxcarbazepine  (TRILEPTAL) 300 MG tablet Take 300-600 mg by mouth as directed. Take 1 tablet (300 mg) by mouth every morning and Take 2 tablets (600 mg) at bedtime     No current facility-administered medications on file prior to visit.     Allergies  Allergen Reactions  . Thorazine [Chlorpromazine] Other (See Comments)    seizure  . Thorazine [Chlorpromazine]     seizures    Social History   Socioeconomic History  . Marital status: Single    Spouse name: Not on file  . Number of children: Not on file  . Years of education: Not on file  . Highest education level: Not on file  Occupational History  . Not on file  Social Needs  . Financial resource strain: Not on file  . Food insecurity    Worry: Not on file    Inability: Not on file  . Transportation needs    Medical: Not on file    Non-medical: Not on file  Tobacco Use  . Smoking status: Former Smoker    Years: 2.00  . Smokeless tobacco: Never Used  . Tobacco comment: Stopped this year  Substance and Sexual Activity  . Alcohol use: Not Currently    Frequency: Never  . Drug use: Not Currently  . Sexual activity: Not Currently    Partners: Male    Birth control/protection: None  Lifestyle  .  Physical activity    Days per week: Not on file    Minutes per session: Not on file  . Stress: Not on file  Relationships  . Social Musicianconnections    Talks on phone: Not on file    Gets together: Not on file    Attends religious service: Not on file    Active member of club or organization: Not on file    Attends meetings of clubs or organizations: Not on file    Relationship status: Not on file  . Intimate partner violence    Fear of current or ex partner: Not on file    Emotionally abused: Not on file    Physically abused: Not on file    Forced sexual activity: Not on file  Other Topics Concern  . Not on file  Social History Narrative   ** Merged History Encounter **        Family History  Adopted: Yes  Problem Relation Age of  Onset  . Bipolar disorder Mother     The following portions of the patient's history were reviewed and updated as appropriate: allergies, current medications, past family history, past medical history, past social history, past surgical history and problem list.  Review of Systems Pertinent items are noted in HPI.   Objective:  BP 124/73   Pulse 69   Temp 98 F (36.7 C) (Oral)   Ht 5\' 3"  (1.6 m)   Wt 223 lb (101.2 kg)   BMI 39.50 kg/m  CONSTITUTIONAL: Well-developed, well-nourished female in no acute distress.  HENT:  Normocephalic, atraumatic, External right and left ear normal. Oropharynx is clear and moist EYES: Conjunctivae and EOM are normal. Pupils are equal, round, and reactive to light. No scleral icterus.  NECK: Normal range of motion, supple, no masses.  Normal thyroid.  SKIN: Skin is warm and dry. No rash noted. Not diaphoretic. No erythema. No pallor. NEUROLOGIC: Alert and oriented to person, place, and time. Normal reflexes, muscle tone coordination. No cranial nerve deficit noted. PSYCHIATRIC: Normal mood and affect. Normal behavior. Normal judgment and thought content. CARDIOVASCULAR: Normal heart rate noted, regular rhythm RESPIRATORY: Clear to auscultation bilaterally. Effort and breath sounds normal, no problems with respiration noted. BREASTS: Symmetric in size. No masses, skin changes, nipple drainage, or lymphadenopathy. ABDOMEN: Soft, normal bowel sounds, no distention noted.  No tenderness, rebound or guarding.  PELVIC: Normal appearing external genitalia; normal appearing vaginal mucosa, cervix glimpsed only briefly due to patient discomfort with exam, somewhat blind pap smear done, cervix palpated, nulliparous and very small, no masses palpable. No abnormal discharge noted.  Normal uterine size, no other palpable masses, no uterine or adnexal tenderness. MUSCULOSKELETAL: Normal range of motion. No tenderness.  No cyanosis, clubbing, or edema.  2+ distal pulses.   Exam done with chaperone present.  Assessment and Plan:   1. Vaginal odor - Cervicovaginal ancillary only( [place])  2. Screening for cervical cancer - Cytology - PAP( [place])  3. Dysuria - POCT Urinalysis Dipstick - Urine Culture  4. Well woman exam No issues Blind pap done due to patient discomfort  5. Routine screening for STI (sexually transmitted infection) Declines HIV/RPR/Hepatitis B&C   Will follow up results of pap smear/STI screen and manage accordingly. Encouraged improvement in diet and exercise.   Routine preventative health maintenance measures emphasized. Please refer to After Visit Summary for other counseling recommendations.    Baldemar LenisK. Meryl Davis, M.D. Attending Center for [facility](Faculty Practice)

## 2018-11-01 LAB — CYTOLOGY - PAP: Diagnosis: NEGATIVE

## 2018-11-02 LAB — CERVICOVAGINAL ANCILLARY ONLY
Bacterial vaginitis: NEGATIVE
Candida vaginitis: NEGATIVE
Chlamydia: NEGATIVE
Neisseria Gonorrhea: NEGATIVE
Trichomonas: NEGATIVE

## 2018-11-02 LAB — URINE CULTURE

## 2018-11-07 ENCOUNTER — Telehealth: Payer: Self-pay | Admitting: *Deleted

## 2018-11-07 NOTE — Telephone Encounter (Signed)
Patient and care provider to inform of normal PAP, negative vaginal cultures and urine culture.  Derl Barrow, RN

## 2018-11-07 NOTE — Telephone Encounter (Signed)
-----   Message from Sloan Leiter, MD sent at 11/03/2018 11:19 AM EDT ----- Please call the home and let them know normal pap, neg UCx, neg for vaginal infection

## 2019-01-05 ENCOUNTER — Other Ambulatory Visit: Payer: Self-pay

## 2019-01-05 ENCOUNTER — Encounter (HOSPITAL_COMMUNITY): Payer: Self-pay | Admitting: Emergency Medicine

## 2019-01-05 ENCOUNTER — Emergency Department (HOSPITAL_COMMUNITY)
Admission: EM | Admit: 2019-01-05 | Discharge: 2019-01-05 | Disposition: A | Discharge status: Home / Self Care | Payer: Medicaid Other | Attending: Emergency Medicine | Admitting: Emergency Medicine

## 2019-01-05 DIAGNOSIS — Z87891 Personal history of nicotine dependence: Secondary | ICD-10-CM | POA: Diagnosis not present

## 2019-01-05 DIAGNOSIS — Z79899 Other long term (current) drug therapy: Secondary | ICD-10-CM | POA: Insufficient documentation

## 2019-01-05 DIAGNOSIS — N390 Urinary tract infection, site not specified: Secondary | ICD-10-CM | POA: Diagnosis not present

## 2019-01-05 DIAGNOSIS — R319 Hematuria, unspecified: Secondary | ICD-10-CM | POA: Insufficient documentation

## 2019-01-05 DIAGNOSIS — Z7689 Persons encountering health services in other specified circumstances: Secondary | ICD-10-CM

## 2019-01-05 DIAGNOSIS — Z202 Contact with and (suspected) exposure to infections with a predominantly sexual mode of transmission: Secondary | ICD-10-CM | POA: Insufficient documentation

## 2019-01-05 DIAGNOSIS — Z20828 Contact with and (suspected) exposure to other viral communicable diseases: Secondary | ICD-10-CM | POA: Insufficient documentation

## 2019-01-05 DIAGNOSIS — F4310 Post-traumatic stress disorder, unspecified: Secondary | ICD-10-CM | POA: Diagnosis present

## 2019-01-05 DIAGNOSIS — F319 Bipolar disorder, unspecified: Secondary | ICD-10-CM | POA: Diagnosis not present

## 2019-01-05 DIAGNOSIS — F4324 Adjustment disorder with disturbance of conduct: Secondary | ICD-10-CM | POA: Insufficient documentation

## 2019-01-05 LAB — COMPREHENSIVE METABOLIC PANEL
ALT: 22 U/L (ref 0–44)
AST: 20 U/L (ref 15–41)
Albumin: 4 g/dL (ref 3.5–5.0)
Alkaline Phosphatase: 68 U/L (ref 38–126)
Anion gap: 13 (ref 5–15)
BUN: 18 mg/dL (ref 6–20)
CO2: 24 mmol/L (ref 22–32)
Calcium: 9.1 mg/dL (ref 8.9–10.3)
Chloride: 104 mmol/L (ref 98–111)
Creatinine, Ser: 0.75 mg/dL (ref 0.44–1.00)
GFR calc Af Amer: >60 mL/min (ref >60)
GFR calc non Af Amer: >60 mL/min (ref >60)
Glucose, Bld: 83 mg/dL (ref 70–99)
Potassium: 3.7 mmol/L (ref 3.5–5.1)
Sodium: 141 mmol/L (ref 135–145)
Total Bilirubin: 0.9 mg/dL (ref 0.3–1.2)
Total Protein: 6.9 g/dL (ref 6.5–8.1)

## 2019-01-05 LAB — CBC WITH DIFFERENTIAL/PLATELET
Abs Immature Granulocytes: 0.02 10*3/uL (ref 0.00–0.07)
Basophils Absolute: 0.1 10*3/uL (ref 0.0–0.1)
Basophils Relative: 1 %
Eosinophils Absolute: 0 10*3/uL (ref 0.0–0.5)
Eosinophils Relative: 0 %
HCT: 40 % (ref 36.0–46.0)
Hemoglobin: 13.1 g/dL (ref 12.0–15.0)
Immature Granulocytes: 0 %
Lymphocytes Relative: 29 %
Lymphs Abs: 2.3 10*3/uL (ref 0.7–4.0)
MCH: 30.3 pg (ref 26.0–34.0)
MCHC: 32.8 g/dL (ref 30.0–36.0)
MCV: 92.4 fL (ref 80.0–100.0)
Monocytes Absolute: 0.7 10*3/uL (ref 0.1–1.0)
Monocytes Relative: 9 %
Neutro Abs: 4.8 10*3/uL (ref 1.7–7.7)
Neutrophils Relative %: 61 %
Platelets: 302 10*3/uL (ref 150–400)
RBC: 4.33 MIL/uL (ref 3.87–5.11)
RDW: 12.3 % (ref 11.5–15.5)
WBC: 7.9 10*3/uL (ref 4.0–10.5)
nRBC: 0 % (ref 0.0–0.2)

## 2019-01-05 LAB — RAPID URINE DRUG SCREEN, HOSP PERFORMED
Amphetamines: NOT DETECTED
Barbiturates: NOT DETECTED
Benzodiazepines: NOT DETECTED
Cocaine: NOT DETECTED
Opiates: NOT DETECTED
Tetrahydrocannabinol: POSITIVE — AB

## 2019-01-05 LAB — WET PREP, GENITAL
Clue Cells Wet Prep HPF POC: NONE SEEN
Sperm: NONE SEEN
Trich, Wet Prep: NONE SEEN
Yeast Wet Prep HPF POC: NONE SEEN

## 2019-01-05 LAB — URINALYSIS, ROUTINE W REFLEX MICROSCOPIC
Bilirubin Urine: NEGATIVE
Glucose, UA: NEGATIVE mg/dL
Ketones, ur: 15 mg/dL — AB
Nitrite: POSITIVE — AB
Protein, ur: NEGATIVE mg/dL
Specific Gravity, Urine: 1.01 (ref 1.005–1.030)
pH: 6 (ref 5.0–8.0)

## 2019-01-05 LAB — URINALYSIS, MICROSCOPIC (REFLEX)

## 2019-01-05 LAB — SARS CORONAVIRUS 2 BY RT PCR (HOSPITAL ORDER, PERFORMED IN ~~LOC~~ HOSPITAL LAB): SARS Coronavirus 2: NEGATIVE

## 2019-01-05 LAB — I-STAT BETA HCG BLOOD, ED (MC, WL, AP ONLY): I-stat hCG, quantitative: <5 m[IU]/mL (ref <5)

## 2019-01-05 LAB — HIV ANTIBODY (ROUTINE TESTING W REFLEX): HIV Screen 4th Generation wRfx: NONREACTIVE

## 2019-01-05 MED ORDER — ONDANSETRON 4 MG PO TBDP
4.0000 mg | ORAL_TABLET | Freq: Once | ORAL | Status: AC
  Administered 2019-01-05: 4 mg via ORAL
  Filled 2019-01-05: qty 1

## 2019-01-05 MED ORDER — AZITHROMYCIN 250 MG PO TABS
1000.0000 mg | ORAL_TABLET | Freq: Once | ORAL | Status: AC
  Administered 2019-01-05: 1000 mg via ORAL
  Filled 2019-01-05: qty 4

## 2019-01-05 MED ORDER — CEPHALEXIN 500 MG PO CAPS: 500.0000 mg | ORAL_CAPSULE | Freq: Two times a day (BID) | ORAL | 0 refills | Status: AC

## 2019-01-05 MED ORDER — LIDOCAINE HCL (PF) 1 % IJ SOLN
INTRAMUSCULAR | Status: AC
  Filled 2019-01-05: qty 5

## 2019-01-05 MED ORDER — CEFTRIAXONE SODIUM 250 MG IJ SOLR
250.0000 mg | Freq: Once | INTRAMUSCULAR | Status: AC
  Administered 2019-01-05: 250 mg via INTRAMUSCULAR
  Filled 2019-01-05: qty 250
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
## 2019-01-05 NOTE — ED Notes (Signed)
Patient verbalizes understanding of discharge instructions. This RN went over discharge instructions with [facility] group home. Opportunity for questioning and answers were provided. Armband removed by staff, pt discharged from ED with her group home staff and [employer]. Patients guardian notified.
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
## 2019-01-05 NOTE — Discharge Instructions (Addendum)
You were given a prescription for antibiotics for a urinary tract infection. Please take the antibiotic prescription fully.   You have been tested for HIV, syphilis, chlamydia and gonorrhea.  These results will be available in approximately 3 days and you will be contacted by the hospital if the results are positive. Avoid sexual contact until you are aware of the results, and please inform all sexual partners if you test positive for any of these diseases.  Please follow up with your primary care provider within 5-7 days for re-evaluation of your symptoms.   Please return to the emergency department for any new or worsening symptoms.
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
## 2019-01-06 LAB — RPR: RPR Ser Ql: NONREACTIVE

## 2019-01-08 ENCOUNTER — Emergency Department (HOSPITAL_COMMUNITY)
Admission: EM | Admit: 2019-01-08 | Discharge: 2019-01-09 | Disposition: A | Discharge status: Home / Self Care | Payer: Medicaid Other | Attending: Emergency Medicine | Admitting: Emergency Medicine

## 2019-01-08 ENCOUNTER — Encounter (HOSPITAL_COMMUNITY): Payer: Self-pay

## 2019-01-08 ENCOUNTER — Ambulatory Visit (HOSPITAL_COMMUNITY)
Admission: EM | Admit: 2019-01-08 | Discharge: 2019-01-08 | Disposition: A | Discharge status: Home / Self Care | Payer: Medicaid Other | Attending: Internal Medicine | Admitting: Internal Medicine

## 2019-01-08 ENCOUNTER — Ambulatory Visit (HOSPITAL_COMMUNITY)
Admission: RE | Admit: 2019-01-08 | Discharge: 2019-01-08 | Disposition: A | Discharge status: Home / Self Care | Payer: Medicaid Other | Attending: Psychiatry | Admitting: Psychiatry

## 2019-01-08 ENCOUNTER — Ambulatory Visit (INDEPENDENT_AMBULATORY_CARE_PROVIDER_SITE_OTHER): Payer: Medicaid Other

## 2019-01-08 ENCOUNTER — Other Ambulatory Visit: Payer: Self-pay

## 2019-01-08 ENCOUNTER — Encounter (HOSPITAL_COMMUNITY): Payer: Self-pay | Admitting: *Deleted

## 2019-01-08 DIAGNOSIS — Z20828 Contact with and (suspected) exposure to other viral communicable diseases: Secondary | ICD-10-CM | POA: Insufficient documentation

## 2019-01-08 DIAGNOSIS — F319 Bipolar disorder, unspecified: Secondary | ICD-10-CM | POA: Insufficient documentation

## 2019-01-08 DIAGNOSIS — R45851 Suicidal ideations: Secondary | ICD-10-CM | POA: Diagnosis not present

## 2019-01-08 DIAGNOSIS — F603 Borderline personality disorder: Secondary | ICD-10-CM | POA: Insufficient documentation

## 2019-01-08 DIAGNOSIS — Z87891 Personal history of nicotine dependence: Secondary | ICD-10-CM | POA: Diagnosis not present

## 2019-01-08 DIAGNOSIS — R45850 Homicidal ideations: Secondary | ICD-10-CM | POA: Insufficient documentation

## 2019-01-08 DIAGNOSIS — S91331A Puncture wound without foreign body, right foot, initial encounter: Secondary | ICD-10-CM | POA: Diagnosis not present

## 2019-01-08 DIAGNOSIS — R4689 Other symptoms and signs involving appearance and behavior: Secondary | ICD-10-CM

## 2019-01-08 DIAGNOSIS — Z79899 Other long term (current) drug therapy: Secondary | ICD-10-CM | POA: Diagnosis not present

## 2019-01-08 DIAGNOSIS — Z23 Encounter for immunization: Secondary | ICD-10-CM

## 2019-01-08 DIAGNOSIS — Z046 Encounter for general psychiatric examination, requested by authority: Secondary | ICD-10-CM | POA: Diagnosis present

## 2019-01-08 LAB — CBC WITH DIFFERENTIAL/PLATELET
Abs Immature Granulocytes: 0.03 10*3/uL (ref 0.00–0.07)
Basophils Absolute: 0.1 10*3/uL (ref 0.0–0.1)
Basophils Relative: 1 %
Eosinophils Absolute: 0.2 10*3/uL (ref 0.0–0.5)
Eosinophils Relative: 2 %
HCT: 38.2 % (ref 36.0–46.0)
Hemoglobin: 12.4 g/dL (ref 12.0–15.0)
Immature Granulocytes: 0 %
Lymphocytes Relative: 40 %
Lymphs Abs: 3.5 10*3/uL (ref 0.7–4.0)
MCH: 29.3 pg (ref 26.0–34.0)
MCHC: 32.5 g/dL (ref 30.0–36.0)
MCV: 90.3 fL (ref 80.0–100.0)
Monocytes Absolute: 0.8 10*3/uL (ref 0.1–1.0)
Monocytes Relative: 9 %
Neutro Abs: 4.2 10*3/uL (ref 1.7–7.7)
Neutrophils Relative %: 48 %
Platelets: 318 10*3/uL (ref 150–400)
RBC: 4.23 MIL/uL (ref 3.87–5.11)
RDW: 12.3 % (ref 11.5–15.5)
WBC: 8.4 10*3/uL (ref 4.0–10.5)
nRBC: 0 % (ref 0.0–0.2)

## 2019-01-08 LAB — BASIC METABOLIC PANEL
Anion gap: 10 (ref 5–15)
BUN: 12 mg/dL (ref 6–20)
CO2: 23 mmol/L (ref 22–32)
Calcium: 9 mg/dL (ref 8.9–10.3)
Chloride: 104 mmol/L (ref 98–111)
Creatinine, Ser: 0.9 mg/dL (ref 0.44–1.00)
GFR calc Af Amer: >60 mL/min (ref >60)
GFR calc non Af Amer: >60 mL/min (ref >60)
Glucose, Bld: 93 mg/dL (ref 70–99)
Potassium: 4.5 mmol/L (ref 3.5–5.1)
Sodium: 137 mmol/L (ref 135–145)

## 2019-01-08 LAB — ETHANOL: Alcohol, Ethyl (B): <10 mg/dL (ref <10)

## 2019-01-08 LAB — SALICYLATE LEVEL: Salicylate Lvl: <7 mg/dL (ref 2.8–30.0)

## 2019-01-08 LAB — ACETAMINOPHEN LEVEL: Acetaminophen (Tylenol), Serum: <10 ug/mL — ABNORMAL LOW (ref 10–30)

## 2019-01-08 MED ORDER — PROPRANOLOL HCL 10 MG PO TABS
10.0000 mg | ORAL_TABLET | Freq: Two times a day (BID) | ORAL | Status: DC
  Administered 2019-01-08: 22:00:00 10 mg via ORAL
  Filled 2019-01-08 (×3): qty 1

## 2019-01-08 MED ORDER — MELATONIN 3 MG PO TABS
6.0000 mg | ORAL_TABLET | Freq: Every day | ORAL | Status: DC
  Administered 2019-01-08: 6 mg via ORAL
  Filled 2019-01-08: qty 2

## 2019-01-08 MED ORDER — TETANUS-DIPHTH-ACELL PERTUSSIS 5-2.5-18.5 LF-MCG/0.5 IM SUSP
0.5000 mL | Freq: Once | INTRAMUSCULAR | Status: AC
  Administered 2019-01-08: 0.5 mL via INTRAMUSCULAR

## 2019-01-08 MED ORDER — FLUOXETINE HCL 20 MG PO CAPS
20.0000 mg | ORAL_CAPSULE | Freq: Every day | ORAL | Status: DC
  Filled 2019-01-08: qty 1

## 2019-01-08 MED ORDER — CEPHALEXIN 500 MG PO CAPS
500.0000 mg | ORAL_CAPSULE | Freq: Two times a day (BID) | ORAL | Status: DC
  Administered 2019-01-08 – 2019-01-09 (×2): 500 mg via ORAL
  Filled 2019-01-08 (×2): qty 1

## 2019-01-08 MED ORDER — DOCUSATE SODIUM 100 MG PO CAPS
100.0000 mg | ORAL_CAPSULE | Freq: Two times a day (BID) | ORAL | Status: DC
  Administered 2019-01-08 – 2019-01-09 (×2): 100 mg via ORAL
  Filled 2019-01-08 (×2): qty 1

## 2019-01-08 MED ORDER — METFORMIN HCL ER 500 MG PO TB24
500.0000 mg | ORAL_TABLET | Freq: Every day | ORAL | Status: DC
  Administered 2019-01-09: 500 mg via ORAL
  Filled 2019-01-08: qty 1

## 2019-01-08 MED ORDER — ACETAMINOPHEN 325 MG PO TABS
650.0000 mg | ORAL_TABLET | ORAL | Status: DC | PRN
  Administered 2019-01-08: 650 mg via ORAL
  Filled 2019-01-08: qty 2

## 2019-01-08 MED ORDER — LEVOFLOXACIN 500 MG PO TABS: 500.0000 mg | ORAL_TABLET | Freq: Every day | ORAL | 0 refills | Status: AC

## 2019-01-08 MED ORDER — TETANUS-DIPHTH-ACELL PERTUSSIS 5-2.5-18.5 LF-MCG/0.5 IM SUSP
INTRAMUSCULAR | Status: AC
  Filled 2019-01-08: qty 0.5
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
## 2019-01-08 NOTE — Progress Notes (Signed)
Patient went to ED prior to being seen. LCSW was obtaining collateral from group home, in order to make final disposition.
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
## 2019-01-08 NOTE — ED Provider Notes (Deleted)
Informed from nursing that the patient has been cleared by behavioral health.  They asked if I could rescind her IVC.  IVC paperwork filled out for removal of IVC.Marland Kitchen    Hayden Rasmussen, MD 01/08/19 [phone]

## 2019-01-09 ENCOUNTER — Emergency Department (HOSPITAL_COMMUNITY)
Admission: EM | Admit: 2019-01-09 | Discharge: 2019-01-10 | Disposition: A | Discharge status: Home / Self Care | Payer: Medicaid Other | Source: Home / Self Care | Attending: Emergency Medicine | Admitting: Emergency Medicine

## 2019-01-09 ENCOUNTER — Other Ambulatory Visit: Payer: Self-pay

## 2019-01-09 ENCOUNTER — Emergency Department (HOSPITAL_COMMUNITY): Payer: Medicaid Other

## 2019-01-09 ENCOUNTER — Encounter (HOSPITAL_COMMUNITY): Payer: Self-pay | Admitting: Emergency Medicine

## 2019-01-09 DIAGNOSIS — G479 Sleep disorder, unspecified: Secondary | ICD-10-CM | POA: Insufficient documentation

## 2019-01-09 DIAGNOSIS — F918 Other conduct disorders: Secondary | ICD-10-CM | POA: Insufficient documentation

## 2019-01-09 DIAGNOSIS — Z87891 Personal history of nicotine dependence: Secondary | ICD-10-CM | POA: Insufficient documentation

## 2019-01-09 DIAGNOSIS — R45851 Suicidal ideations: Secondary | ICD-10-CM | POA: Diagnosis not present

## 2019-01-09 DIAGNOSIS — F603 Borderline personality disorder: Secondary | ICD-10-CM | POA: Insufficient documentation

## 2019-01-09 DIAGNOSIS — R45850 Homicidal ideations: Secondary | ICD-10-CM | POA: Insufficient documentation

## 2019-01-09 DIAGNOSIS — Z20828 Contact with and (suspected) exposure to other viral communicable diseases: Secondary | ICD-10-CM | POA: Insufficient documentation

## 2019-01-09 DIAGNOSIS — M79671 Pain in right foot: Secondary | ICD-10-CM | POA: Insufficient documentation

## 2019-01-09 DIAGNOSIS — Z79899 Other long term (current) drug therapy: Secondary | ICD-10-CM | POA: Insufficient documentation

## 2019-01-09 DIAGNOSIS — F319 Bipolar disorder, unspecified: Secondary | ICD-10-CM | POA: Insufficient documentation

## 2019-01-09 DIAGNOSIS — S90414A Abrasion, right lesser toe(s), initial encounter: Secondary | ICD-10-CM | POA: Insufficient documentation

## 2019-01-09 DIAGNOSIS — Y999 Unspecified external cause status: Secondary | ICD-10-CM | POA: Insufficient documentation

## 2019-01-09 DIAGNOSIS — M25561 Pain in right knee: Secondary | ICD-10-CM | POA: Insufficient documentation

## 2019-01-09 DIAGNOSIS — Y9389 Activity, other specified: Secondary | ICD-10-CM | POA: Insufficient documentation

## 2019-01-09 DIAGNOSIS — Z7984 Long term (current) use of oral hypoglycemic drugs: Secondary | ICD-10-CM | POA: Insufficient documentation

## 2019-01-09 DIAGNOSIS — R4689 Other symptoms and signs involving appearance and behavior: Secondary | ICD-10-CM

## 2019-01-09 DIAGNOSIS — Z046 Encounter for general psychiatric examination, requested by authority: Secondary | ICD-10-CM | POA: Insufficient documentation

## 2019-01-09 DIAGNOSIS — Y9210 Unspecified residential institution as the place of occurrence of the external cause: Secondary | ICD-10-CM | POA: Insufficient documentation

## 2019-01-09 LAB — CBC WITH DIFFERENTIAL/PLATELET
Abs Immature Granulocytes: 0.03 10*3/uL (ref 0.00–0.07)
Basophils Absolute: 0.1 10*3/uL (ref 0.0–0.1)
Basophils Relative: 1 %
Eosinophils Absolute: 0.1 10*3/uL (ref 0.0–0.5)
Eosinophils Relative: 1 %
HCT: 44.7 % (ref 36.0–46.0)
Hemoglobin: 14.2 g/dL (ref 12.0–15.0)
Immature Granulocytes: 0 %
Lymphocytes Relative: 25 %
Lymphs Abs: 2.7 10*3/uL (ref 0.7–4.0)
MCH: 29.5 pg (ref 26.0–34.0)
MCHC: 31.8 g/dL (ref 30.0–36.0)
MCV: 92.7 fL (ref 80.0–100.0)
Monocytes Absolute: 0.7 10*3/uL (ref 0.1–1.0)
Monocytes Relative: 7 %
Neutro Abs: 7.5 10*3/uL (ref 1.7–7.7)
Neutrophils Relative %: 66 %
Platelets: 367 10*3/uL (ref 150–400)
RBC: 4.82 MIL/uL (ref 3.87–5.11)
RDW: 12.1 % (ref 11.5–15.5)
WBC: 11.1 10*3/uL — ABNORMAL HIGH (ref 4.0–10.5)
nRBC: 0 % (ref 0.0–0.2)

## 2019-01-09 LAB — COMPREHENSIVE METABOLIC PANEL
ALT: 24 U/L (ref 0–44)
AST: 19 U/L (ref 15–41)
Albumin: 4.1 g/dL (ref 3.5–5.0)
Alkaline Phosphatase: 70 U/L (ref 38–126)
Anion gap: 9 (ref 5–15)
BUN: 9 mg/dL (ref 6–20)
CO2: 27 mmol/L (ref 22–32)
Calcium: 9.6 mg/dL (ref 8.9–10.3)
Chloride: 100 mmol/L (ref 98–111)
Creatinine, Ser: 0.8 mg/dL (ref 0.44–1.00)
GFR calc Af Amer: >60 mL/min (ref >60)
GFR calc non Af Amer: >60 mL/min (ref >60)
Glucose, Bld: 89 mg/dL (ref 70–99)
Potassium: 3.8 mmol/L (ref 3.5–5.1)
Sodium: 136 mmol/L (ref 135–145)
Total Bilirubin: 0.5 mg/dL (ref 0.3–1.2)
Total Protein: 7 g/dL (ref 6.5–8.1)

## 2019-01-09 LAB — RAPID URINE DRUG SCREEN, HOSP PERFORMED
Amphetamines: NOT DETECTED
Amphetamines: NOT DETECTED
Barbiturates: NOT DETECTED
Barbiturates: NOT DETECTED
Benzodiazepines: NOT DETECTED
Benzodiazepines: NOT DETECTED
Cocaine: NOT DETECTED
Cocaine: NOT DETECTED
Opiates: NOT DETECTED
Opiates: NOT DETECTED
Tetrahydrocannabinol: NOT DETECTED
Tetrahydrocannabinol: NOT DETECTED

## 2019-01-09 LAB — GC/CHLAMYDIA PROBE AMP (~~LOC~~) NOT AT ARMC
Chlamydia: NEGATIVE
Neisseria Gonorrhea: NEGATIVE

## 2019-01-09 LAB — ETHANOL: Alcohol, Ethyl (B): <10 mg/dL (ref <10)

## 2019-01-09 LAB — PREGNANCY, URINE: Preg Test, Ur: NEGATIVE

## 2019-01-09 LAB — I-STAT BETA HCG BLOOD, ED (MC, WL, AP ONLY): I-stat hCG, quantitative: <5 m[IU]/mL (ref <5)

## 2019-01-09 NOTE — ED Notes (Signed)
Pt DCd off the unit to home. Pt alert, calm, cooperative, no s/s of distress. DC information given to pt for care giver. Belongings given to pt . Pt ambulatory off unit. Pt escorted by MHT. Pt transported by care giver.
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
## 2019-01-09 NOTE — ED Notes (Signed)
Pt to room 36. Pt oriented to unit. Pt resting comfortably in bed.
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
## 2019-01-09 NOTE — Progress Notes (Addendum)
CSW received consult to assist with getting patient back to her group home. CSW spoke with patient's legal guardian Erby Pian [phone]) who reports patient has to return to her group home. Ms. Heber [place] reports patient was given a 60 day notice for discharge from the group home and Ms. Hoffman plans to find her other placement from there. CSW then spoke with Myrtis Hopping [phone]), owner of [facility], who reports patient needs more help, but understand that she has to come back. Ms. Jerline Pain reports that someone from the group home will be here to pick patient up around 2pm. Ms. Jerline Pain expressed concern that the patient may not get into the car when someone picks her up and CSW explained that the legal guardian can be called at that time to handle the situation. CSW to follow up to ensure patient will be picked up at [place], LCSW [facility] [phone]

## 2019-01-09 NOTE — Discharge Instructions (Addendum)
Followup with your doctor as needed

## 2019-01-09 NOTE — Care Management (Addendum)
CM contacted by Coral Else PA-C concerning patient,she presented today  with behavioral health issues,  ED CSW consulted.

## 2019-01-09 NOTE — ED Notes (Signed)
Pt asleep, will attempt vitals once awake. 

## 2019-01-09 NOTE — Progress Notes (Signed)
Consult request has been received. CSW attempting to follow up at present time  Lott Seelbach M. Loyal Holzheimer LCSWA Trans[facility] of Care  Clinical Social Worker  Ph: [phone] 
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
## 2019-01-09 NOTE — ED Triage Notes (Signed)
Escorted with [employer]
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
## 2019-01-09 NOTE — ED Provider Notes (Signed)
Has no complaints of at this time.  Has been accepted to go back to her group home.  IVC rescinded   Lacretia Leigh, MD 01/09/19 1050

## 2019-01-09 NOTE — ED Notes (Signed)
Patient transported to X-ray 

## 2019-01-10 ENCOUNTER — Observation Stay (HOSPITAL_COMMUNITY): Admission: AD | Admit: 2019-01-10 | Payer: Medicaid Other | Source: Intra-hospital | Admitting: Psychiatry

## 2019-01-10 LAB — CBG MONITORING, ED: Glucose-Capillary: 87 mg/dL (ref 70–99)

## 2019-01-10 LAB — SARS CORONAVIRUS 2 BY RT PCR (HOSPITAL ORDER, PERFORMED IN ~~LOC~~ HOSPITAL LAB): SARS Coronavirus 2: NEGATIVE

## 2019-01-10 MED ORDER — PROPRANOLOL HCL 10 MG PO TABS
10.0000 mg | ORAL_TABLET | Freq: Two times a day (BID) | ORAL | Status: DC
  Administered 2019-01-10: 10 mg via ORAL
  Filled 2019-01-10 (×2): qty 1

## 2019-01-10 MED ORDER — MELATONIN 3 MG PO TABS
6.0000 mg | ORAL_TABLET | Freq: Every day | ORAL | Status: DC
  Filled 2019-01-10: qty 2

## 2019-01-10 MED ORDER — FLUOXETINE HCL 20 MG PO CAPS
20.0000 mg | ORAL_CAPSULE | Freq: Every day | ORAL | Status: DC
  Administered 2019-01-10: 20 mg via ORAL
  Filled 2019-01-10: qty 1

## 2019-01-10 MED ORDER — METFORMIN HCL ER 500 MG PO TB24
500.0000 mg | ORAL_TABLET | Freq: Every day | ORAL | Status: DC
  Administered 2019-01-10: 500 mg via ORAL
  Filled 2019-01-10: qty 1

## 2019-01-10 MED ORDER — CEPHALEXIN 250 MG PO CAPS
500.0000 mg | ORAL_CAPSULE | Freq: Two times a day (BID) | ORAL | Status: DC
  Administered 2019-01-10: 500 mg via ORAL
  Filled 2019-01-10: qty 2

## 2019-01-10 MED ORDER — POLYETHYLENE GLYCOL 3350 17 G PO PACK
17.0000 g | PACK | Freq: Every day | ORAL | Status: DC
  Administered 2019-01-10: 17 g via ORAL
  Filled 2019-01-10: qty 1

## 2019-01-10 MED ORDER — HYDROXYZINE HCL 25 MG PO TABS
25.0000 mg | ORAL_TABLET | Freq: Three times a day (TID) | ORAL | Status: DC | PRN
  Administered 2019-01-10: 25 mg via ORAL
  Filled 2019-01-10: qty 1

## 2019-01-10 MED ORDER — DOCUSATE SODIUM 100 MG PO CAPS
100.0000 mg | ORAL_CAPSULE | Freq: Two times a day (BID) | ORAL | Status: DC
  Administered 2019-01-10: 100 mg via ORAL
  Filled 2019-01-10: qty 1

## 2019-01-10 NOTE — ED Notes (Addendum)
Pt attempted to return Danesha's call as Secretary advised she had called.

## 2019-01-10 NOTE — ED Notes (Signed)
Pt changed into burgundy scrub top. Belongings inventoried and placed in locker #2. Pt given [place] sandwich and sprite

## 2019-01-10 NOTE — ED Notes (Signed)
Pt aware of tx plan - Carrie Carr to come pick her up, per SW.

## 2019-01-10 NOTE — ED Notes (Signed)
Pt was given [place] sandwich bag and Sprite

## 2019-01-10 NOTE — ED Notes (Signed)
Breakfast Ordered 

## 2019-01-10 NOTE — BH Assessment (Deleted)
Pt has been accepted to the Observation Unit and can arrive at any time. This information was provided to pt's nurse, Chelsea RN, at 0407.  Room: 203-1 Accepting: Jason Berry, NP Attending: Dr. Kumar Call to Report: [phone] 

## 2019-01-10 NOTE — BH Assessment (Signed)
Clinician contacted pt's On-Call Guardian, Val Riles, to ensure she was made aware that pt was back in the hospital and that pt was going to be observed overnight and re-assessed in the morning. Pt's On-Call Guardian thanked clinician for the information.  [place] [facility]: [phone]

## 2019-01-10 NOTE — ED Notes (Addendum)
Pt attempted to return Danesha's call.

## 2019-01-10 NOTE — ED Notes (Signed)
Pt stated she spoke w/her Legal Guardian who told her she did not have to go back to the group home. States she will assist w/finding her somewhere else to go.

## 2019-01-10 NOTE — ED Notes (Signed)
Diet was ordered for Lunch. 
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
## 2019-01-10 NOTE — ED Notes (Signed)
Patient make 1 phone call this morning.

## 2019-01-10 NOTE — ED Notes (Addendum)
Patient Guardian Name is Sharalyn Ink # [phone].

## 2019-01-10 NOTE — ED Notes (Signed)
Sam from [facility] contacted this RN stating that after further evaluation, they are unable to accept the pt to [facility] at this time.

## 2019-01-10 NOTE — ED Notes (Signed)
Pt spoke w/Danesha.
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
## 2019-01-10 NOTE — ED Notes (Signed)
TTS in process at this time  

## 2019-01-10 NOTE — ED Notes (Deleted)
Pt has been accepted to the Observation Unit and can arrive at any time. This information was provided to pt's nurse, Lannie Fields, at [phone].  Room: 203-1 Accepting: Lindon Romp, NP Attending: Dr. Dwyane Dee Call to Report: [phone]

## 2019-01-10 NOTE — ED Notes (Signed)
Pt belongings placed in Locker #2 °

## 2019-09-05 ENCOUNTER — Ambulatory Visit (HOSPITAL_COMMUNITY)
Admission: AD | Admit: 2019-09-05 | Discharge: 2019-09-05 | Disposition: A | Discharge status: Home / Self Care | Payer: Medicaid Other | Attending: Psychiatry | Admitting: Psychiatry

## 2019-09-05 DIAGNOSIS — F332 Major depressive disorder, recurrent severe without psychotic features: Secondary | ICD-10-CM | POA: Diagnosis not present
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
